# Patient Record
Sex: Female | Born: 1970
Health system: Southern US, Community
[De-identification: ages and names within clinical notes are randomized; demographics above are authoritative.]

## PROBLEM LIST (undated history)

## (undated) DIAGNOSIS — T7840XA Allergy, unspecified, initial encounter: Secondary | ICD-10-CM

## (undated) DIAGNOSIS — M1990 Unspecified osteoarthritis, unspecified site: Secondary | ICD-10-CM

## (undated) DIAGNOSIS — E039 Hypothyroidism, unspecified: Secondary | ICD-10-CM

## (undated) DIAGNOSIS — E038 Other specified hypothyroidism: Secondary | ICD-10-CM

## (undated) DIAGNOSIS — D649 Anemia, unspecified: Secondary | ICD-10-CM

## (undated) HISTORY — DX: Unspecified osteoarthritis, unspecified site: M19.90

## (undated) HISTORY — DX: Allergy, unspecified, initial encounter: T78.40XA

## (undated) HISTORY — DX: Other specified hypothyroidism: E03.8

## (undated) HISTORY — DX: Anemia, unspecified: D64.9

---
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
## 2012-02-04 HISTORY — PX: OOPHORECTOMY: SHX86

## 2017-01-30 ENCOUNTER — Other Ambulatory Visit: Payer: Self-pay | Admitting: Internal Medicine

## 2017-01-30 ENCOUNTER — Ambulatory Visit
Admission: RE | Admit: 2017-01-30 | Discharge: 2017-01-30 | Disposition: A | Discharge status: Home / Self Care | Payer: No Typology Code available for payment source | Source: Ambulatory Visit | Attending: Internal Medicine | Admitting: Internal Medicine

## 2017-01-30 DIAGNOSIS — R05 Cough: Secondary | ICD-10-CM

## 2017-01-30 DIAGNOSIS — R059 Cough, unspecified: Secondary | ICD-10-CM

## 2017-01-30 DIAGNOSIS — Z111 Encounter for screening for respiratory tuberculosis: Secondary | ICD-10-CM

## 2019-03-25 DIAGNOSIS — Z03818 Encounter for observation for suspected exposure to other biological agents ruled out: Secondary | ICD-10-CM | POA: Diagnosis not present

## 2019-03-25 DIAGNOSIS — Z20828 Contact with and (suspected) exposure to other viral communicable diseases: Secondary | ICD-10-CM | POA: Diagnosis not present

## 2019-05-20 ENCOUNTER — Encounter: Payer: Self-pay | Admitting: Family

## 2019-05-20 ENCOUNTER — Other Ambulatory Visit: Payer: Self-pay

## 2019-05-20 ENCOUNTER — Ambulatory Visit: Payer: BC Managed Care – PPO | Admitting: Family

## 2019-05-20 VITALS — BP 130/74 | HR 64 | Temp 97.3°F | Resp 16 | Ht 65.0 in | Wt 149.0 lb

## 2019-05-20 DIAGNOSIS — E039 Hypothyroidism, unspecified: Secondary | ICD-10-CM | POA: Diagnosis not present

## 2019-05-20 DIAGNOSIS — Z0000 Encounter for general adult medical examination without abnormal findings: Secondary | ICD-10-CM | POA: Diagnosis not present

## 2019-05-23 LAB — TEST AUTHORIZATION

## 2019-05-23 LAB — CBC WITH DIFFERENTIAL/PLATELET
Absolute Monocytes: 343 cells/uL (ref 200–950)
Basophils Absolute: 44 cells/uL (ref 0–200)
Basophils Relative: 0.6 %
Eosinophils Absolute: 131 cells/uL (ref 15–500)
Eosinophils Relative: 1.8 %
HCT: 38.3 % (ref 35.0–45.0)
Hemoglobin: 12.4 g/dL (ref 11.7–15.5)
Lymphs Abs: 3139 cells/uL (ref 850–3900)
MCH: 26.8 pg — ABNORMAL LOW (ref 27.0–33.0)
MCHC: 32.4 g/dL (ref 32.0–36.0)
MCV: 82.7 fL (ref 80.0–100.0)
MPV: 11.5 fL (ref 7.5–12.5)
Monocytes Relative: 4.7 %
Neutro Abs: 3643 cells/uL (ref 1500–7800)
Neutrophils Relative %: 49.9 %
Platelets: 123 10*3/uL — ABNORMAL LOW (ref 140–400)
RBC: 4.63 10*6/uL (ref 3.80–5.10)
RDW: 13.1 % (ref 11.0–15.0)
Total Lymphocyte: 43 %
WBC: 7.3 10*3/uL (ref 3.8–10.8)

## 2019-05-23 LAB — HEPATIC FUNCTION PANEL
AG Ratio: 1.4 (calc) (ref 1.0–2.5)
ALT: 20 U/L (ref 6–29)
AST: 21 U/L (ref 10–35)
Albumin: 4.5 g/dL (ref 3.6–5.1)
Alkaline phosphatase (APISO): 64 U/L (ref 31–125)
Bilirubin, Direct: 0 mg/dL (ref 0.0–0.2)
Globulin: 3.2 g/dL (calc) (ref 1.9–3.7)
Indirect Bilirubin: 0.3 mg/dL (calc) (ref 0.2–1.2)
Total Bilirubin: 0.3 mg/dL (ref 0.2–1.2)
Total Protein: 7.7 g/dL (ref 6.1–8.1)

## 2019-05-23 LAB — TSH: TSH: 4.77 mIU/L — ABNORMAL HIGH

## 2019-05-23 LAB — BASIC METABOLIC PANEL
BUN: 19 mg/dL (ref 7–25)
CO2: 29 mmol/L (ref 20–32)
Calcium: 9.6 mg/dL (ref 8.6–10.2)
Chloride: 101 mmol/L (ref 98–110)
Creat: 0.81 mg/dL (ref 0.50–1.10)
Glucose, Bld: 101 mg/dL — ABNORMAL HIGH (ref 65–99)
Potassium: 4.1 mmol/L (ref 3.5–5.3)
Sodium: 141 mmol/L (ref 135–146)

## 2019-05-23 LAB — LIPID PANEL
Cholesterol: 155 mg/dL (ref <200)
HDL: 46 mg/dL — ABNORMAL LOW (ref >=50)
LDL Cholesterol (Calc): 87 mg/dL (calc)
Non-HDL Cholesterol (Calc): 109 mg/dL (calc) (ref <130)
Total CHOL/HDL Ratio: 3.4 (calc) (ref <5.0)
Triglycerides: 121 mg/dL (ref <150)

## 2019-05-23 LAB — T4, FREE: Free T4: 1.2 ng/dL (ref 0.8–1.8)

## 2019-05-23 LAB — T3, FREE: T3, Free: 2.9 pg/mL (ref 2.3–4.2)

## 2019-05-24 ENCOUNTER — Encounter: Payer: Self-pay | Admitting: Family

## 2019-05-24 ENCOUNTER — Ambulatory Visit (HOSPITAL_BASED_OUTPATIENT_CLINIC_OR_DEPARTMENT_OTHER)
Admission: RE | Admit: 2019-05-24 | Discharge: 2019-05-24 | Disposition: A | Discharge status: Home / Self Care | Payer: BC Managed Care – PPO | Source: Ambulatory Visit | Attending: Family | Admitting: Family

## 2019-05-24 ENCOUNTER — Other Ambulatory Visit: Payer: Self-pay

## 2019-05-24 DIAGNOSIS — Z0000 Encounter for general adult medical examination without abnormal findings: Secondary | ICD-10-CM

## 2019-05-24 DIAGNOSIS — Z1231 Encounter for screening mammogram for malignant neoplasm of breast: Secondary | ICD-10-CM | POA: Diagnosis not present

## 2019-05-24 DIAGNOSIS — E039 Hypothyroidism, unspecified: Secondary | ICD-10-CM | POA: Insufficient documentation

## 2019-05-24 DIAGNOSIS — E038 Other specified hypothyroidism: Secondary | ICD-10-CM

## 2019-05-24 NOTE — Progress Notes (Signed)
Subjective:  Subjective  Patient ID: Amanda Roberson, female DOB: 1970/12/26, 49 y.o. MRN: [id number]  HPI  Patient is a 49 yr old female who presents today to establish care.  Came to [place] 2 years ago and has not had medical evaluation.  Patient presents today for complete physical.  Immunizations: tetanus- unsure  Diet: reports overall healthy diet  Exercise: no regular exercise  Pap Smear: reports 2 years ago in [place]- reports that it was normal.  Mammogram: never  Dental: 1 month ago  Vision: last year  LMP 1 year  pmhx- healthy  Review of Systems  Constitutional: Negative for unexpected weight change.  HENT: Negative for hearing loss and rhinorrhea.  Eyes: Negative for visual disturbance.  Respiratory: Negative for cough and shortness of breath.  Cardiovascular: Negative for chest pain.  Gastrointestinal: Negative for blood in stool, constipation (occasional) and diarrhea.  Genitourinary: Negative for dysuria and frequency.  Musculoskeletal: Positive for arthralgias (sometimes). Negative for myalgias.  Skin: Negative for rash.  Neurological: Negative for headaches.  Hematological: Negative for adenopathy.  Psychiatric/Behavioral:  Denies depression/anxiety  History reviewed. No pertinent past medical history.  Social History        Socioeconomic History  . Marital status: Married    Spouse name: Not on file  . Number of children: Not on file  . Years of education: Not on file  . Highest education level: Not on file  Occupational History  . Not on file  Tobacco Use  . Smoking status: Never Smoker  . Smokeless tobacco: Never Used  Substance and Sexual Activity  . Alcohol use: Never  . Drug use: Never  . Sexual activity: Yes    Partners: Male  Other Topics Concern  . Not on file  Social History Narrative   Moved from [place] 2019   Married   2 children in [place] (26 and 23)   Works at [employer]    Enjoys gardening/sewing   No pets   Completed 8th grade in [place] Strain:   . Difficulty of Paying Living Expenses:   Food Insecurity:   . Worried About Charity fundraiser in the Last Year:   . Arboriculturist in the Last Year:   Transportation Needs:   . Film/video editor (Medical):   Marland Kitchen Lack of Transportation (Non-Medical):   Physical Activity:   . Days of Exercise per Week:   . Minutes of Exercise per Session:   Stress:   . Feeling of Stress :   Social Connections:   . Frequency of Communication with Friends and Family:   . Frequency of Social Gatherings with Friends and Family:   . Attends Religious Services:   . Active Member of Clubs or Organizations:   . Attends Archivist Meetings:   Marland Kitchen Marital Status:   Intimate Partner Violence:   . Fear of Current or Ex-Partner:   . Emotionally Abused:   Marland Kitchen Physically Abused:   . Sexually Abused:         Past Surgical History:  Procedure Laterality Date  . OOPHORECTOMY Left 2014        Family History  Problem Relation Age of Onset  . Breast cancer Sister    died at age 32   Not on File  No current outpatient medications on file prior to visit.   No current facility-administered medications on file prior to visit.   BP  130/74 (BP Location: Right Arm, Patient Position: Sitting, Cuff Size: Small)  Pulse 64  Temp (!) 97.3 F (36.3 C) (Temporal)  Resp 16  Ht 5\' 5"  (1.651 m)  Wt 149 lb (67.6 kg)  SpO2 100%  BMI 24.79 kg/m  Objective:  Objective  Physical Exam  Physical Exam  Constitutional: She is oriented to person, place, and time. She appears well-developed and well-nourished. No distress.  HENT:  Head: Normocephalic and atraumatic.  Right Ear: Tympanic membrane and ear canal normal.  Left Ear: Tympanic membrane and ear canal normal.  Mouth/Throat: Oropharynx is clear and moist.  Eyes: Pupils are equal, round, and reactive to light. No scleral icterus.  Neck: Normal range of motion. No thyromegaly  present.  Cardiovascular: Normal rate and regular rhythm.  No murmur heard.  Pulmonary/Chest: Effort normal and breath sounds normal. No respiratory distress. He has no wheezes. She has no rales. She exhibits no tenderness.  Abdominal: Soft. Bowel sounds are normal. She exhibits no distension and no mass. There is no tenderness. There is no rebound and no guarding.  Musculoskeletal: She exhibits no edema.  Lymphadenopathy:  She has no cervical adenopathy.  Neurological: She is alert and oriented to person, place, and time. She has normal patellar reflexes. She exhibits normal muscle tone. Coordination normal.  Skin: Skin is warm and dry.  Psychiatric: She has a normal mood and affect. Her behavior is normal. Judgment and thought content normal.  Breasts: Examined lying  Right: Without masses, retractions, discharge or axillary adenopathy.  Left: Without masses, retractions, discharge or axillary adenopathy.  Inguinal/mons: Normal without inguinal adenopathy  External genitalia: Normal  BUS/Urethra/Skene's glands: Normal  Bladder: Normal  Vagina: Normal  Cervix: Normal  Uterus: normal in size, shape and contour. Midline and mobile  Adnexa/parametria:  Rt: Without masses or tenderness.  Lt: Without masses or tenderness.  Anus and perineum: Normal   Assessment & Plan:  Preventative care- plan pap next year. Mammogram scheduled. Obtain routine lab work.   Assessment & Plan:  A video interpreter was utilized today- #[id number]  This visit occurred during the SARS-CoV-2 public health emergency.  Safety protocols were in place, including screening questions prior to the visit, additional usage of staff PPE, and extensive cleaning of exam room while observing appropriate contact time as indicated for disinfecting solutions.

## 2019-06-02 NOTE — Progress Notes (Signed)
Printed for patient

## 2019-11-04 ENCOUNTER — Ambulatory Visit (INDEPENDENT_AMBULATORY_CARE_PROVIDER_SITE_OTHER): Payer: BC Managed Care – PPO | Admitting: Family

## 2019-11-04 ENCOUNTER — Other Ambulatory Visit: Payer: Self-pay

## 2019-11-04 ENCOUNTER — Other Ambulatory Visit: Payer: Self-pay | Admitting: Family

## 2019-11-04 VITALS — BP 127/83 | HR 66 | Temp 98.3°F | Resp 16 | Ht 65.0 in | Wt 153.0 lb

## 2019-11-04 DIAGNOSIS — Z23 Encounter for immunization: Secondary | ICD-10-CM

## 2019-11-04 DIAGNOSIS — E038 Other specified hypothyroidism: Secondary | ICD-10-CM

## 2019-11-04 DIAGNOSIS — E039 Hypothyroidism, unspecified: Secondary | ICD-10-CM

## 2019-11-04 DIAGNOSIS — N912 Amenorrhea, unspecified: Secondary | ICD-10-CM | POA: Diagnosis not present

## 2019-11-04 DIAGNOSIS — L237 Allergic contact dermatitis due to plants, except food: Secondary | ICD-10-CM

## 2019-11-04 LAB — ESTRADIOL: Estradiol: 16 pg/mL

## 2019-11-04 LAB — T3, FREE: T3, Free: 1.4 pg/mL — ABNORMAL LOW (ref 2.3–4.2)

## 2019-11-04 LAB — T4, FREE: Free T4: 0.3 ng/dL — ABNORMAL LOW (ref 0.8–1.8)

## 2019-11-04 LAB — FOLLICLE STIMULATING HORMONE: FSH: 74.1 m[IU]/mL

## 2019-11-04 LAB — TSH: TSH: 133.17 mIU/L — ABNORMAL HIGH

## 2019-11-04 LAB — LUTEINIZING HORMONE: LH: 37.6 m[IU]/mL

## 2019-11-04 MED ORDER — BETAMETHASONE VALERATE 0.1 % EX OINT: 1.0000 "application " | TOPICAL_OINTMENT | Freq: Two times a day (BID) | CUTANEOUS | 0 refills | Status: DC

## 2019-11-04 MED FILL — BETAMETHASONE VALER 0.1% OI: 0.1 | 15 days supply | Qty: 30 | Fill #0

## 2019-11-04 NOTE — Progress Notes (Signed)
Subjective:    Patient ID: Amanda Roberson, female    DOB: 1970-05-14, 49 y.o.   MRN: [id number]  HPI  Patient is a 49 yr old female who presents today with chief complaint of rash. Rash is worst on her hands but has on multiple other areas of her body. Started 2 weeks ago.  Reports associated itching.  She thinks that this is poison ivy.  She was working cleaning her back yard. Initially the itching was so bad that she could not sleep, however now the itching is less severe.   Amenorrhea- LMP 2019.   A Burmese video interpreter was utilized for today's  Hilbert Odor #[id number])  Review of Systems See HPI  Past Medical History:  Diagnosis Date  . Subclinical hypothyroidism      Social History   Socioeconomic History  . Marital status: Married    Spouse name: Not on file  . Number of children: Not on file  . Years of education: Not on file  . Highest education level: Not on file  Occupational History  . Not on file  Tobacco Use  . Smoking status: Never Smoker  . Smokeless tobacco: Never Used  Substance and Sexual Activity  . Alcohol use: Never  . Drug use: Never  . Sexual activity: Yes    Partners: Male  Other Topics Concern  . Not on file  Social History Narrative   Moved from [place] 2019   Married   2 children in [place] (26 and 23)   Works at [employer]    Enjoys gardening/sewing   No pets   Completed 8th grade in [place] Strain:   . Difficulty of Paying Living Expenses: Not on file  Food Insecurity:   . Worried About Charity fundraiser in the Last Year: Not on file  . Ran Out of Food in the Last Year: Not on file  Transportation Needs:   . Lack of Transportation (Medical): Not on file  . Lack of Transportation (Non-Medical): Not on file  Physical Activity:   . Days of Exercise per Week: Not on file  . Minutes of Exercise per Session: Not on file  Stress:   . Feeling of Stress : Not on file  Social Connections:     . Frequency of Communication with Friends and Family: Not on file  . Frequency of Social Gatherings with Friends and Family: Not on file  . Attends Religious Services: Not on file  . Active Member of Clubs or Organizations: Not on file  . Attends Archivist Meetings: Not on file  . Marital Status: Not on file  Intimate Partner Violence:   . Fear of Current or Ex-Partner: Not on file  . Emotionally Abused: Not on file  . Physically Abused: Not on file  . Sexually Abused: Not on file    Past Surgical History:  Procedure Laterality Date  . OOPHORECTOMY Left 2014    Family History  Problem Relation Age of Onset  . Breast cancer Sister        died at age 43    Not on File  No current outpatient medications on file prior to visit.   No current facility-administered medications on file prior to visit.    BP 127/83 (BP Location: Right Arm, Patient Position: Sitting, Cuff Size: Small)   Pulse 66   Temp 98.3 F (36.8 C) (Oral)   Resp 16   Ht 5'  5" (1.651 m)   Wt 153 lb (69.4 kg)   SpO2 99%   BMI 25.46 kg/m       Objective:   Physical Exam Constitutional:      Appearance: Normal appearance.  HENT:     Head: Normocephalic and atraumatic.  Skin:    Comments: Erythematous linear rash noted on bilateral forearms, hands, ankles  Neurological:     Mental Status: She is alert.           Assessment & Plan:  Poison Ivy Dermatitis-will rx with betamethasone cream. I don't think she requires oral steroids at this point. Pt is advised to call if symptoms worsen or if symptoms fail to improve.  Amenorrhea- pt would like testing to confirm menopause.  Order FSH/LH and estradiol.  Hypothyroid- Last visit TSH was noted to be mildly elevated with normal free t3 and free t4. Obtain follow up free T3, Free T4 and TSH.  This visit occurred during the SARS-CoV-2 public health emergency.  Safety protocols were in place, including screening questions prior to the visit,  additional usage of staff PPE, and extensive cleaning of exam room while observing appropriate contact time as indicated for disinfecting solutions.

## 2019-11-06 ENCOUNTER — Other Ambulatory Visit: Payer: Self-pay | Admitting: Family

## 2019-11-06 DIAGNOSIS — E039 Hypothyroidism, unspecified: Secondary | ICD-10-CM

## 2019-11-06 NOTE — Telephone Encounter (Signed)
Do we have access to a telephone interpretor?  I think we do- Amanda Roberson may have this information.  Please contact pt and let her know that her lab work confirms that she is in menopause.    Lab work shows that she has an underactive thyroid.  I would like for her to start synthroid 14mcg once daily in the AM and return for lab work in 6 weeks to repeat TSH.

## 2019-11-08 NOTE — Telephone Encounter (Signed)
Called patient with Burmese interpreter on the line, she did not answer. Interpreter left voice mail to call us back about her lab results.

## 2019-11-23 ENCOUNTER — Other Ambulatory Visit: Payer: Self-pay | Admitting: Family

## 2019-11-23 MED ORDER — LEVOTHYROXINE SODIUM 50 MCG PO TABS: 50.0000 ug | ORAL_TABLET | Freq: Every day | ORAL | 1 refills | Status: DC

## 2019-11-23 MED FILL — LEVOTHYROXINE SODIUM 50 MCG: 50 | 90 days supply | Qty: 90 | Fill #0

## 2019-11-23 NOTE — Telephone Encounter (Signed)
Please send her a letter as follows:  Lab work shows that you have an underactive thyroid.  I would like for you to start synthroid 38mcg once daily in the AM and return for lab work in 6 weeks to repeat your blood test. The new prescription has been sent to the [facility].

## 2019-11-23 NOTE — Progress Notes (Signed)
Mailed out to patient 

## 2019-11-24 NOTE — Telephone Encounter (Signed)
Letter with this information mailed to patient to home address on file.

## 2019-12-05 MED FILL — LEVOTHYROXINE SODIUM 50 MCG: 50 | 90 days supply | Qty: 90 | Fill #0

## 2020-01-24 ENCOUNTER — Other Ambulatory Visit: Payer: Self-pay

## 2020-01-24 ENCOUNTER — Other Ambulatory Visit (INDEPENDENT_AMBULATORY_CARE_PROVIDER_SITE_OTHER): Payer: BC Managed Care – PPO

## 2020-01-24 DIAGNOSIS — E039 Hypothyroidism, unspecified: Secondary | ICD-10-CM

## 2020-01-24 NOTE — Addendum Note (Signed)
Addended by: Kelle Darting A on: 01/24/2020 03:14 PM   Modules accepted: Orders

## 2020-01-25 ENCOUNTER — Other Ambulatory Visit: Payer: Self-pay | Admitting: Family

## 2020-01-25 ENCOUNTER — Telehealth: Payer: Self-pay | Admitting: Family

## 2020-01-25 LAB — TSH: TSH: 16.6 u[IU]/mL — ABNORMAL HIGH (ref 0.35–4.50)

## 2020-01-25 MED ORDER — LEVOTHYROXINE SODIUM 75 MCG PO TABS: 75.0000 ug | ORAL_TABLET | Freq: Every day | ORAL | 0 refills | Status: DC

## 2020-01-25 MED FILL — LEVOTHYROXINE SODIUM 75 MCG: 75 | 90 days supply | Qty: 90 | Fill #0

## 2020-01-25 NOTE — Telephone Encounter (Signed)
Thyroid test is improving but still shows synthroid needs to be adjusted. Please increase synthroid from 50 mcg once daily to 75 mcg once daily and repeat TSH in 6 weeks. Dx hypothyroid.

## 2020-01-25 NOTE — Telephone Encounter (Signed)
Appointment is scheduled. Pt and family are aware of appointment and new medication.

## 2020-03-06 ENCOUNTER — Other Ambulatory Visit (INDEPENDENT_AMBULATORY_CARE_PROVIDER_SITE_OTHER): Payer: BC Managed Care – PPO

## 2020-03-06 ENCOUNTER — Other Ambulatory Visit: Payer: Self-pay

## 2020-03-06 DIAGNOSIS — E039 Hypothyroidism, unspecified: Secondary | ICD-10-CM | POA: Diagnosis not present

## 2020-03-07 ENCOUNTER — Other Ambulatory Visit: Payer: Self-pay

## 2020-03-07 ENCOUNTER — Other Ambulatory Visit: Payer: BC Managed Care – PPO

## 2020-03-07 ENCOUNTER — Telehealth: Payer: Self-pay | Admitting: Family

## 2020-03-07 DIAGNOSIS — E039 Hypothyroidism, unspecified: Secondary | ICD-10-CM

## 2020-03-07 LAB — TSH: TSH: 0.29 u[IU]/mL — ABNORMAL LOW (ref 0.35–4.50)

## 2020-03-07 MED ORDER — LEVOTHYROXINE SODIUM 75 MCG PO TABS: ORAL_TABLET | ORAL | 0 refills | Status: DC

## 2020-03-07 NOTE — Telephone Encounter (Signed)
Called patient earlier but was not able to communicate due to language barrier.  Patient called back with Burmese interpreter was was given instructions to continue to take 75 mcg every day except for satuerdays when she will take 1/2 a tablet.  She was scheduled to come back for [facility] 04-20-2020

## 2020-03-07 NOTE — Telephone Encounter (Signed)
Repeat TSH in 6 weeks

## 2020-03-07 NOTE — Telephone Encounter (Signed)
TSH is almost where we need it to be.  I would recommend that she continue synthroid 75 mcg once daily except take only 1/2 tablet on Saturdays.

## 2020-04-20 ENCOUNTER — Other Ambulatory Visit (INDEPENDENT_AMBULATORY_CARE_PROVIDER_SITE_OTHER): Payer: BC Managed Care – PPO

## 2020-04-20 ENCOUNTER — Other Ambulatory Visit: Payer: Self-pay

## 2020-04-20 DIAGNOSIS — E039 Hypothyroidism, unspecified: Secondary | ICD-10-CM

## 2020-04-21 LAB — TSH: TSH: 2.5 mIU/L

## 2020-05-04 ENCOUNTER — Telehealth: Payer: Self-pay | Admitting: Family

## 2020-05-04 NOTE — Telephone Encounter (Signed)
Patient would also like to know if she had continue medication for thyroid, if so she will need a refill.

## 2020-05-04 NOTE — Telephone Encounter (Signed)
Patient would like a call back in reference to lab results

## 2020-05-05 ENCOUNTER — Other Ambulatory Visit: Payer: Self-pay | Admitting: Family

## 2020-05-05 ENCOUNTER — Other Ambulatory Visit (HOSPITAL_BASED_OUTPATIENT_CLINIC_OR_DEPARTMENT_OTHER): Payer: Self-pay

## 2020-05-05 MED ORDER — LEVOTHYROXINE SODIUM 75 MCG PO TABS
ORAL_TABLET | ORAL | 1 refills | Status: DC
  Filled 2020-05-05: qty 78, 84d supply, fill #0

## 2020-05-05 NOTE — Telephone Encounter (Signed)
Yes, please continue levothyroxine. Refill sent to pharmacy.

## 2020-05-07 NOTE — Telephone Encounter (Signed)
Lvm for patient to call back

## 2020-05-07 NOTE — Telephone Encounter (Signed)
Patient called back with a translator, she was advised of results and to continue the medication. She was scheduled to return in 3 months.

## 2020-06-05 ENCOUNTER — Encounter: Payer: Self-pay | Admitting: Family

## 2020-06-05 ENCOUNTER — Other Ambulatory Visit: Payer: Self-pay

## 2020-06-05 ENCOUNTER — Other Ambulatory Visit (HOSPITAL_COMMUNITY)
Admission: RE | Admit: 2020-06-05 | Discharge: 2020-06-05 | Disposition: A | Discharge status: Home / Self Care | Payer: BC Managed Care – PPO | Source: Ambulatory Visit | Attending: Family | Admitting: Family

## 2020-06-05 ENCOUNTER — Ambulatory Visit (INDEPENDENT_AMBULATORY_CARE_PROVIDER_SITE_OTHER): Payer: BC Managed Care – PPO | Admitting: Family

## 2020-06-05 VITALS — BP 130/71 | HR 77 | Temp 98.5°F | Resp 16 | Ht 65.0 in | Wt 149.0 lb

## 2020-06-05 DIAGNOSIS — E039 Hypothyroidism, unspecified: Secondary | ICD-10-CM

## 2020-06-05 DIAGNOSIS — Z1159 Encounter for screening for other viral diseases: Secondary | ICD-10-CM | POA: Diagnosis not present

## 2020-06-05 DIAGNOSIS — Z01419 Encounter for gynecological examination (general) (routine) without abnormal findings: Secondary | ICD-10-CM | POA: Insufficient documentation

## 2020-06-05 DIAGNOSIS — Z0000 Encounter for general adult medical examination without abnormal findings: Secondary | ICD-10-CM | POA: Diagnosis not present

## 2020-06-05 DIAGNOSIS — Z114 Encounter for screening for human immunodeficiency virus [HIV]: Secondary | ICD-10-CM

## 2020-06-05 NOTE — Progress Notes (Signed)
Subjective:    Patient ID: Amanda Roberson, female    DOB: 12/02/1970, 50 y.o.   MRN: [id number]  HPI  Patient presents today for complete physical.  Immunizations: had J and J x 2.  She has not had a 3rd shot, unsure of last tetanus shot- thinks 3 years ago Diet: healthy Exercise: some days she does some stretching exercise.   Colonoscopy: due Pap Smear: due Mammogram: due Dental:  due Vision: up to date  She reports that she is following with Endo.   Used video interpretor [id number]   Review of Systems  Constitutional: Negative for unexpected weight change.  HENT: Positive for rhinorrhea (mild allergies). Negative for hearing loss.   Eyes: Negative for visual disturbance.  Respiratory: Negative for cough and shortness of breath.   Cardiovascular: Negative for leg swelling.  Gastrointestinal: Negative for constipation and diarrhea.  Genitourinary: Negative for dysuria and frequency.  Musculoskeletal:       Some left heel pain in the am  Neurological: Negative for headaches.  Hematological: Negative for adenopathy.  Psychiatric/Behavioral:       Denies depression/anxiety   Past Medical History:  Diagnosis Date  . Subclinical hypothyroidism      Social History   Socioeconomic History  . Marital status: Married    Spouse name: Not on file  . Number of children: Not on file  . Years of education: Not on file  . Highest education level: Not on file  Occupational History  . Not on file  Tobacco Use  . Smoking status: Never Smoker  . Smokeless tobacco: Never Used  Substance and Sexual Activity  . Alcohol use: Never  . Drug use: Never  . Sexual activity: Yes    Partners: Male  Other Topics Concern  . Not on file  Social History Narrative   Moved from [place] 2019   Married   2 children in [place] (26 and 23)   Works at [employer]    Enjoys gardening/sewing   No pets   Completed 8th grade in [place] Strain: Not on  [employer] Insecurity: Not on file  Transportation Needs: Not on file  Physical Activity: Not on file  Stress: Not on file  Social Connections: Not on file  Intimate Partner Violence: Not on file    Past Surgical History:  Procedure Laterality Date  . OOPHORECTOMY Left 2014    Family History  Problem Relation Age of Onset  . Breast cancer Sister        died at age 3    Not on File  Current Outpatient Medications on File Prior to Visit  Medication Sig Dispense Refill  . levothyroxine (SYNTHROID) 75 MCG tablet TAKE 1 TABLET BY MOUTH ONCE DAILY EXCEPT 1/2 TABLET ON SATURDAYS 90 tablet 1   No current facility-administered medications on file prior to visit.    BP 130/71 (BP Location: Right Arm, Patient Position: Sitting, Cuff Size: Small)   Pulse 77   Temp 98.5 F (36.9 C) (Oral)   Resp 16   Ht 5\' 5"  (1.651 m)   Wt 149 lb (67.6 kg)   SpO2 100%   BMI 24.79 kg/m        Objective:   Physical Exam  Physical Exam  Constitutional: She is oriented to person, place, and time. She appears well-developed and well-nourished. No distress.  HENT:  Head: Normocephalic and atraumatic.  Right Ear: Tympanic membrane and ear canal normal.  Left Ear: Tympanic membrane and ear canal normal.  Mouth/Throat: Not examined- pt wearing mask Eyes: Pupils are equal, round, and reactive to light. No scleral icterus.  Neck: Normal range of motion. No thyromegaly present.  Cardiovascular: Normal rate and regular rhythm.   No murmur heard. Pulmonary/Chest: Effort normal and breath sounds normal. No respiratory distress. He has no wheezes. She has no rales. She exhibits no tenderness.  Abdominal: Soft. Bowel sounds are normal. She exhibits no distension and no mass. There is no tenderness. There is no rebound and no guarding.  Musculoskeletal: She exhibits no edema.  Lymphadenopathy:    She has no cervical adenopathy.  Neurological: She is alert and oriented to person, place, and time. She  has normal patellar reflexes. She exhibits normal muscle tone. Coordination normal.  Skin: Skin is warm and dry.  Psychiatric: She has a normal mood and affect. Her behavior is normal. Judgment and thought content normal.  Breasts: Examined lying Right: Without masses, retractions, discharge or axillary adenopathy.  Left: Without masses, retractions, discharge or axillary adenopathy.  Inguinal/mons: Normal without inguinal adenopathy  External genitalia: Normal  BUS/Urethra/Skene's glands: Normal  Bladder: Normal  Vagina: Normal  Cervix: Normal  Uterus: normal in size, shape and contour. Midline and mobile  Adnexa/parametria:  Rt: Without masses or tenderness.  Lt: Without masses or tenderness.  Anus and perineum: Normal            Assessment & Plan:    Preventative care- will obtain labs as ordered at pt's request.  Refer for mammogram, colo.  Immunizations reviewed.   This visit occurred during the SARS-CoV-2 public health emergency.  Safety protocols were in place, including screening questions prior to the visit, additional usage of staff PPE, and extensive cleaning of exam room while observing appropriate contact time as indicated for disinfecting solutions.        Assessment & Plan:

## 2020-06-05 NOTE — Patient Instructions (Addendum)
Please schedule a routine dental visit.  Please ice your left foot twice daily as needed and wear good supportive shoes.

## 2020-06-06 ENCOUNTER — Telehealth: Payer: Self-pay

## 2020-06-06 LAB — LIPID PANEL
Cholesterol: 164 mg/dL (ref 0–200)
HDL: 44.6 mg/dL (ref >39.00)
LDL Cholesterol: 101 mg/dL — ABNORMAL HIGH (ref 0–99)
NonHDL: 119.14
Total CHOL/HDL Ratio: 4
Triglycerides: 89 mg/dL (ref 0.0–149.0)
VLDL: 17.8 mg/dL (ref 0.0–40.0)

## 2020-06-06 LAB — COMPREHENSIVE METABOLIC PANEL
ALT: 20 U/L (ref 0–35)
AST: 21 U/L (ref 0–37)
Albumin: 4.3 g/dL (ref 3.5–5.2)
Alkaline Phosphatase: 59 U/L (ref 39–117)
BUN: 18 mg/dL (ref 6–23)
CO2: 30 mEq/L (ref 19–32)
Calcium: 9.5 mg/dL (ref 8.4–10.5)
Chloride: 103 mEq/L (ref 96–112)
Creatinine, Ser: 0.69 mg/dL (ref 0.40–1.20)
GFR: 102.55 mL/min (ref >60.00)
Glucose, Bld: 88 mg/dL (ref 70–99)
Potassium: 4 mEq/L (ref 3.5–5.1)
Sodium: 140 mEq/L (ref 135–145)
Total Bilirubin: 0.3 mg/dL (ref 0.2–1.2)
Total Protein: 7.6 g/dL (ref 6.0–8.3)

## 2020-06-06 LAB — HIV ANTIBODY (ROUTINE TESTING W REFLEX): HIV 1&2 Ab, 4th Generation: NONREACTIVE

## 2020-06-06 LAB — HEPATITIS C ANTIBODY
Hepatitis C Ab: NONREACTIVE
SIGNAL TO CUT-OFF: 0.02 (ref <1.00)

## 2020-06-06 NOTE — Telephone Encounter (Signed)
Called left voicemail for patient to return call to office to schedule lab appointment for recollect on CBC with diff.

## 2020-06-07 ENCOUNTER — Other Ambulatory Visit (INDEPENDENT_AMBULATORY_CARE_PROVIDER_SITE_OTHER): Payer: BC Managed Care – PPO

## 2020-06-07 ENCOUNTER — Other Ambulatory Visit: Payer: Self-pay

## 2020-06-07 DIAGNOSIS — E039 Hypothyroidism, unspecified: Secondary | ICD-10-CM

## 2020-06-07 DIAGNOSIS — Z0000 Encounter for general adult medical examination without abnormal findings: Secondary | ICD-10-CM | POA: Diagnosis not present

## 2020-06-07 NOTE — Addendum Note (Signed)
Addended by: Manuela Schwartz on: 06/07/2020 03:15 PM   Modules accepted: Orders

## 2020-06-08 ENCOUNTER — Encounter: Payer: Self-pay | Admitting: Family

## 2020-06-08 LAB — CBC WITH DIFFERENTIAL/PLATELET
Basophils Absolute: 0 10*3/uL (ref 0.0–0.1)
Basophils Relative: 0.8 % (ref 0.0–3.0)
Eosinophils Absolute: 0.1 10*3/uL (ref 0.0–0.7)
Eosinophils Relative: 2.9 % (ref 0.0–5.0)
HCT: 35.2 % — ABNORMAL LOW (ref 36.0–46.0)
Hemoglobin: 11.4 g/dL — ABNORMAL LOW (ref 12.0–15.0)
Lymphocytes Relative: 46.5 % — ABNORMAL HIGH (ref 12.0–46.0)
Lymphs Abs: 2 10*3/uL (ref 0.7–4.0)
MCHC: 32.4 g/dL (ref 30.0–36.0)
MCV: 83.2 fl (ref 78.0–100.0)
Monocytes Absolute: 0.4 10*3/uL (ref 0.1–1.0)
Monocytes Relative: 9.5 % (ref 3.0–12.0)
Neutro Abs: 1.7 10*3/uL (ref 1.4–7.7)
Neutrophils Relative %: 40.3 % — ABNORMAL LOW (ref 43.0–77.0)
Platelets: 147 10*3/uL — ABNORMAL LOW (ref 150.0–400.0)
RBC: 4.23 Mil/uL (ref 3.87–5.11)
RDW: 13.9 % (ref 11.5–15.5)
WBC: 3.6 10*3/uL — ABNORMAL LOW (ref 4.0–10.5)

## 2020-06-08 LAB — CYTOLOGY - PAP
Comment: NEGATIVE
Diagnosis: NEGATIVE
High risk HPV: NEGATIVE

## 2020-06-08 NOTE — Progress Notes (Signed)
Mailed out to patient 

## 2020-06-11 ENCOUNTER — Ambulatory Visit (HOSPITAL_BASED_OUTPATIENT_CLINIC_OR_DEPARTMENT_OTHER)
Admission: RE | Admit: 2020-06-11 | Discharge: 2020-06-11 | Disposition: A | Discharge status: Home / Self Care | Payer: BC Managed Care – PPO | Source: Ambulatory Visit | Attending: Family | Admitting: Family

## 2020-06-11 ENCOUNTER — Other Ambulatory Visit: Payer: Self-pay

## 2020-06-11 ENCOUNTER — Encounter (HOSPITAL_BASED_OUTPATIENT_CLINIC_OR_DEPARTMENT_OTHER): Payer: Self-pay

## 2020-06-11 DIAGNOSIS — Z0000 Encounter for general adult medical examination without abnormal findings: Secondary | ICD-10-CM

## 2020-06-11 DIAGNOSIS — Z1231 Encounter for screening mammogram for malignant neoplasm of breast: Secondary | ICD-10-CM | POA: Diagnosis not present

## 2020-07-27 ENCOUNTER — Other Ambulatory Visit: Payer: Self-pay

## 2020-07-27 ENCOUNTER — Other Ambulatory Visit (HOSPITAL_BASED_OUTPATIENT_CLINIC_OR_DEPARTMENT_OTHER): Payer: Self-pay

## 2020-07-27 ENCOUNTER — Ambulatory Visit: Payer: BC Managed Care – PPO | Admitting: Family

## 2020-07-27 ENCOUNTER — Encounter: Payer: Self-pay | Admitting: Family

## 2020-07-27 VITALS — BP 121/66 | HR 70 | Temp 98.5°F | Resp 16 | Wt 149.0 lb

## 2020-07-27 DIAGNOSIS — E039 Hypothyroidism, unspecified: Secondary | ICD-10-CM | POA: Diagnosis not present

## 2020-07-27 DIAGNOSIS — Z0000 Encounter for general adult medical examination without abnormal findings: Secondary | ICD-10-CM

## 2020-07-27 LAB — TSH: TSH: 0.88 mIU/L

## 2020-07-27 MED ORDER — LEVOTHYROXINE SODIUM 75 MCG PO TABS
ORAL_TABLET | ORAL | 0 refills | Status: DC
  Filled 2020-07-27: qty 78, 84d supply, fill #0
  Filled 2020-10-16: qty 12, 14d supply, fill #1

## 2020-07-27 NOTE — Assessment & Plan Note (Signed)
Clinically stable on synthroid 75 mcg once daily except 1/2 tab once a week.  Obtain follow up tsh.

## 2020-07-27 NOTE — Patient Instructions (Signed)
Please complete lab work prior to leaving.   

## 2020-07-27 NOTE — Progress Notes (Signed)
Subjective:   By signing my name below, I, [name], attest that this documentation has been prepared under the direction and in the presence of Debbrah Alar NP. 07/27/2020      Patient ID: Amanda Roberson, female    DOB: 1971/01/25, 50 y.o.   MRN: [id number]  Chief Complaint  Patient presents with   Hypothyroidism    Here for follow up    HPI Patient is in today for a office visit.  A [place] Armed forces training and education officer to assist in communication.  Thyroid- She has ran out of is requesting a refill of 75 mcg synthroid daily PO. She has been consistently taking them daily. She reports her energy levels have increased since taking them. Immunizations- She has 2 [employer] and [employer]. Colonoscopy- She is due for a colonoscopy and is willing to set up an appointment.   Health Maintenance Due  Topic Date Due   COLONOSCOPY (Pts 45-30yrs Insurance coverage will need to be confirmed)  Never done   COVID-19 Vaccine (3 - Booster for [employer] series) 07/14/2020    Past Medical History:  Diagnosis Date   Subclinical hypothyroidism     Past Surgical History:  Procedure Laterality Date   OOPHORECTOMY Left 2014    Family History  Problem Relation Age of Onset   Breast cancer Sister        died at age 42    Social History   Socioeconomic History   Marital status: Married    Spouse name: Not on file   Number of children: Not on file   Years of education: Not on file   Highest education level: Not on file  Occupational History   Not on file  Tobacco Use   Smoking status: Never   Smokeless tobacco: Never  Substance and Sexual Activity   Alcohol use: Never   Drug use: Never   Sexual activity: Yes    Partners: Male  Other Topics Concern   Not on file  Social History Narrative   Moved from [place] 2019   Married   2 children in [place] (26 and 23)   Works at [employer]    Enjoys gardening/sewing   No pets   Completed 8th grade in [place] Strain: Not on [employer] Insecurity: Not on file  Transportation Needs: Not on file  Physical Activity: Not on file  Stress: Not on file  Social Connections: Not on file  Intimate Partner Violence: Not on file    Outpatient Medications Prior to Visit  Medication Sig Dispense Refill   levothyroxine (SYNTHROID) 75 MCG tablet TAKE 1 TABLET BY MOUTH ONCE DAILY EXCEPT 1/2 TABLET ON SATURDAYS 90 tablet 1   No facility-administered medications prior to visit.    Not on File  ROS See HPI    Objective:    Physical Exam Constitutional:      Appearance: Normal appearance.  HENT:     Head: Normocephalic and atraumatic.  Neurological:     Mental Status: She is alert and oriented to person, place, and time.  Psychiatric:        Mood and Affect: Mood normal.        Behavior: Behavior normal.        Thought Content: Thought content normal.        Judgment: Judgment normal.    BP 121/66 (BP Location: Right Arm, Patient Position: Sitting, Cuff Size: Small)   Pulse 70  Temp 98.5 F (36.9 C) (Oral)   Resp 16   Wt 149 lb (67.6 kg)   SpO2 99%   BMI 24.79 kg/m  Wt Readings from Last 3 Encounters:  07/27/20 149 lb (67.6 kg)  06/05/20 149 lb (67.6 kg)  11/04/19 153 lb (69.4 kg)       Assessment & Plan:   Problem List Items Addressed This Visit       Unprioritized   Hypothyroid    Clinically stable on synthroid 75 mcg once daily except 1/2 tab once a week.  Obtain follow up tsh.        Relevant Medications   levothyroxine (SYNTHROID) 75 MCG tablet   Other Relevant Orders   TSH   Other Visit Diagnoses     Preventative health care    -  Primary   Relevant Orders   Ambulatory referral to Gastroenterology        Meds ordered this encounter  Medications   levothyroxine (SYNTHROID) 75 MCG tablet    Sig: TAKE 1 TABLET BY MOUTH ONCE DAILY EXCEPT 1/2 TABLET ON SATURDAYS    Dispense:  90 tablet    Refill:  0    Order Specific  Question:   Supervising Provider    Answer:   Mosie Lukes [[id number]]     I, Debbrah Alar NP, personally preformed the services described in this documentation.  All medical record entries made by the scribe were at my direction and in my presence.  I have reviewed the chart and discharge instructions (if applicable) and agree that the record reflects my personal performance and is accurate and complete. 07/27/2020   I,[name],acting as a Education administrator for Nance Pear, NP.,have documented all relevant documentation on the behalf of Nance Pear, NP,as directed by  Nance Pear, NP while in the presence of Nance Pear, NP.   Nance Pear, NP

## 2020-10-16 ENCOUNTER — Other Ambulatory Visit (HOSPITAL_BASED_OUTPATIENT_CLINIC_OR_DEPARTMENT_OTHER): Payer: Self-pay

## 2020-10-30 ENCOUNTER — Other Ambulatory Visit: Payer: Self-pay

## 2020-10-30 ENCOUNTER — Other Ambulatory Visit (HOSPITAL_BASED_OUTPATIENT_CLINIC_OR_DEPARTMENT_OTHER): Payer: Self-pay

## 2020-10-30 ENCOUNTER — Encounter: Payer: Self-pay | Admitting: Family

## 2020-10-30 ENCOUNTER — Ambulatory Visit: Payer: BC Managed Care – PPO | Admitting: Family

## 2020-10-30 VITALS — BP 137/64 | HR 70 | Temp 98.6°F | Resp 16 | Wt 154.0 lb

## 2020-10-30 DIAGNOSIS — E039 Hypothyroidism, unspecified: Secondary | ICD-10-CM | POA: Diagnosis not present

## 2020-10-30 DIAGNOSIS — M722 Plantar fascial fibromatosis: Secondary | ICD-10-CM | POA: Diagnosis not present

## 2020-10-30 DIAGNOSIS — Z23 Encounter for immunization: Secondary | ICD-10-CM

## 2020-10-30 MED ORDER — LEVOTHYROXINE SODIUM 75 MCG PO TABS
ORAL_TABLET | ORAL | 1 refills | Status: DC
  Filled 2020-10-30: qty 84, 90d supply, fill #0
  Filled 2021-02-04: qty 84, 90d supply, fill #1
  Filled 2021-05-03: qty 12, 13d supply, fill #2

## 2020-10-30 MED ORDER — MELOXICAM 7.5 MG PO TABS
7.5000 mg | ORAL_TABLET | Freq: Every day | ORAL | 0 refills | Status: DC
  Filled 2020-10-30: qty 14, 14d supply, fill #0

## 2020-10-30 NOTE — Progress Notes (Signed)
Subjective:   By signing my name below, I, Lyric Barr-McArthur, attest that this documentation has been prepared under the direction and in the presence of Debbrah Alar, NP, 10/30/2020   Patient ID: Amanda Roberson, female    DOB: [birth year], 50 y.o.   MRN: [id number]  No chief complaint on file.   HPI Patient is in today for an office visit. There is a Optometrist present via [facility] [phone])  Thyroid: She notes that her use of 75 mcg synthroid is going well and she is not experiencing any side effects since starting.  Pain in left heel: She is experiencing pain in her left heel especially after walking and putting weight on it. She notes it is swollen. She denies any injury or trauma to the foot.  Colonoscopy: She is inquiring about her colonoscopy that is set for 11/19/2020. She is aware she needs to pick up the prep medications for it three days before the procedure.  Next appointment: She would like to get her physical exam scheduled today to be updated.   Health Maintenance Due  Topic Date Due   COLONOSCOPY (Pts 45-42yrs Insurance coverage will need to be confirmed)  Never done   COVID-19 Vaccine (3 - Booster for [employer] series) 07/14/2020   INFLUENZA VACCINE  09/03/2020    Past Medical History:  Diagnosis Date   Subclinical hypothyroidism     Past Surgical History:  Procedure Laterality Date   OOPHORECTOMY Left 2014    Family History  Problem Relation Age of Onset   Breast cancer Sister        died at age 43    Social History   Socioeconomic History   Marital status: Married    Spouse name: Not on file   Number of children: Not on file   Years of education: Not on file   Highest education level: Not on file  Occupational History   Not on file  Tobacco Use   Smoking status: Never   Smokeless tobacco: Never  Substance and Sexual Activity   Alcohol use: Never   Drug use: Never   Sexual activity: Yes    Partners: Male  Other Topics Concern   Not on  file  Social History Narrative   Moved from [place] 2019   Married   2 children in [place] (26 and 23)   Works at [employer]    Enjoys gardening/sewing   No pets   Completed 8th grade in [place] Strain: Not on [employer] Insecurity: Not on file  Transportation Needs: Not on file  Physical Activity: Not on file  Stress: Not on file  Social Connections: Not on file  Intimate Partner Violence: Not on file    Outpatient Medications Prior to Visit  Medication Sig Dispense Refill   levothyroxine (SYNTHROID) 75 MCG tablet TAKE 1 TABLET BY MOUTH ONCE DAILY EXCEPT 1/2 TABLET ON SATURDAYS 90 tablet 0   No facility-administered medications prior to visit.    Not on File  Review of Systems  Musculoskeletal:        (+) mild edema in left bottom of heel  (+) tenderness in left heel       Objective:    Physical Exam Constitutional:      General: She is not in acute distress.    Appearance: Normal appearance. She is not ill-appearing.  HENT:     Head: Normocephalic and atraumatic.     Right Ear:  External ear normal.     Left Ear: External ear normal.  Eyes:     Extraocular Movements: Extraocular movements intact.     Pupils: Pupils are equal, round, and reactive to light.  Cardiovascular:     Rate and Rhythm: Normal rate and regular rhythm.     Heart sounds: Normal heart sounds. No murmur heard.   No gallop.  Pulmonary:     Effort: Pulmonary effort is normal. No respiratory distress.     Breath sounds: Normal breath sounds. No wheezing or rales.  Feet:     Comments: (+) mild edema in left heel (+) tenderness with palpitation in left heel  Lymphadenopathy:     Cervical: No cervical adenopathy.  Skin:    General: Skin is warm and dry.  Neurological:     Mental Status: She is alert and oriented to person, place, and time.  Psychiatric:        Behavior: Behavior normal.        Judgment: Judgment normal.    BP 137/64 (BP  Location: Right Arm, Patient Position: Sitting, Cuff Size: Small)   Pulse 70   Temp 98.6 F (37 C) (Oral)   Resp 16   Wt 154 lb (69.9 kg)   SpO2 99%   BMI 25.63 kg/m  Wt Readings from Last 3 Encounters:  10/30/20 154 lb (69.9 kg)  07/27/20 149 lb (67.6 kg)  06/05/20 149 lb (67.6 kg)       Assessment & Plan:   Problem List Items Addressed This Visit       Unprioritized   Plantar fasciitis of left foot - Primary    New, advised pt to begin short course of meloxicam 7.5mg  once daily along with bid icing.  Call if symptoms worsen or if symptoms fail to improve in the next few weeks.       Hypothyroid    Clinically stable on synthroid. Continue current dose.   Lab Results  Component Value Date   TSH 0.88 07/27/2020         Relevant Medications   levothyroxine (SYNTHROID) 75 MCG tablet   Meds ordered this encounter  Medications   meloxicam (MOBIC) 7.5 MG tablet    Sig: Take 1 tablet (7.5 mg total) by mouth daily.    Dispense:  14 tablet    Refill:  0    Order Specific Question:   Supervising Provider    Answer:   Penni Homans A [[id number]]   levothyroxine (SYNTHROID) 75 MCG tablet    Sig: TAKE 1 TABLET BY MOUTH ONCE DAILY EXCEPT 1/2 TABLET ON SATURDAYS    Dispense:  90 tablet    Refill:  1    Order Specific Question:   Supervising Provider    Answer:   Mosie Lukes [[id number]]    I, Debbrah Alar, NP, personally preformed the services described in this documentation.  All medical record entries made by the scribe were at my direction and in my presence.  I have reviewed the chart and discharge instructions (if applicable) and agree that the record reflects my personal performance and is accurate and complete. 10/30/2020  I,Lyric Barr-McArthur,acting as a scribe for Nance Pear, NP.,have documented all relevant documentation on the behalf of Nance Pear, NP,as directed by  Nance Pear, NP while in the presence of Nance Pear,  NP.  Nance Pear, NP

## 2020-10-30 NOTE — Assessment & Plan Note (Signed)
Clinically stable on synthroid. Continue current dose.   Lab Results  Component Value Date   TSH 0.88 07/27/2020

## 2020-10-30 NOTE — Addendum Note (Signed)
Addended by: Kelle Darting A on: 10/30/2020 03:28 PM   Modules accepted: Orders

## 2020-10-30 NOTE — Assessment & Plan Note (Signed)
New, advised pt to begin short course of meloxicam 7.5mg  once daily along with bid icing.  Call if symptoms worsen or if symptoms fail to improve in the next few weeks.

## 2020-10-30 NOTE — Addendum Note (Signed)
Addended by: Jiles Prows on: 10/30/2020 03:24 PM   Modules accepted: Orders

## 2020-10-30 NOTE — Patient Instructions (Signed)
Please complete lab work prior to leaving. Begin meloxicam once daily for heel pain. Call if heel pain worsens or if it does not improve.

## 2020-10-31 LAB — TSH: TSH: 2.09 u[IU]/mL (ref 0.35–5.50)

## 2020-11-05 ENCOUNTER — Other Ambulatory Visit (HOSPITAL_BASED_OUTPATIENT_CLINIC_OR_DEPARTMENT_OTHER): Payer: Self-pay

## 2020-11-05 ENCOUNTER — Ambulatory Visit (AMBULATORY_SURGERY_CENTER): Payer: Self-pay | Admitting: *Deleted

## 2020-11-05 ENCOUNTER — Other Ambulatory Visit (HOSPITAL_COMMUNITY): Payer: Self-pay

## 2020-11-05 ENCOUNTER — Other Ambulatory Visit: Payer: Self-pay

## 2020-11-05 VITALS — Ht 65.0 in | Wt 155.0 lb

## 2020-11-05 DIAGNOSIS — Z1211 Encounter for screening for malignant neoplasm of colon: Secondary | ICD-10-CM

## 2020-11-05 MED ORDER — MOVIPREP 100 G PO SOLR
1.0000 | Freq: Once | ORAL | 0 refills | Status: AC
  Filled 2020-11-05: qty 1, 1d supply, fill #0

## 2020-11-05 NOTE — Progress Notes (Signed)
No egg or soy allergy known to patient  No issues known to pt with past sedation with any surgeries or procedures Patient denies ever being told they had issues or difficulty with intubation  No FH of Malignant Hyperthermia Pt is not on diet pills Pt is not on  home 02  Pt is not on blood thinners  Pt denies issues with constipation being  CHRONIC- Occ issue  No A fib or A flutter  EMMI video to pt or via MyChart   Pt is fully vaccinated  for Covid   Due to the COVID-19 pandemic we are asking patients to follow certain guidelines.  Pt aware of COVID protocols and [place] guidelines   [place] language line interpreter used for [facility]- Husband attended [facility] but does not speak [place]

## 2020-11-06 ENCOUNTER — Encounter: Payer: Self-pay | Admitting: Gastroenterology

## 2020-11-06 ENCOUNTER — Other Ambulatory Visit (HOSPITAL_BASED_OUTPATIENT_CLINIC_OR_DEPARTMENT_OTHER): Payer: Self-pay

## 2020-11-07 ENCOUNTER — Other Ambulatory Visit (HOSPITAL_BASED_OUTPATIENT_CLINIC_OR_DEPARTMENT_OTHER): Payer: Self-pay

## 2020-11-18 ENCOUNTER — Encounter: Payer: Self-pay | Admitting: Certified Registered Nurse Anesthetist

## 2020-11-19 ENCOUNTER — Ambulatory Visit (AMBULATORY_SURGERY_CENTER): Payer: BC Managed Care – PPO | Admitting: Gastroenterology

## 2020-11-19 ENCOUNTER — Encounter: Payer: Self-pay | Admitting: Gastroenterology

## 2020-11-19 VITALS — BP 110/64 | HR 74 | Temp 98.0°F | Resp 16 | Ht 65.0 in | Wt 155.0 lb

## 2020-11-19 DIAGNOSIS — K635 Polyp of colon: Secondary | ICD-10-CM | POA: Diagnosis not present

## 2020-11-19 DIAGNOSIS — D124 Benign neoplasm of descending colon: Secondary | ICD-10-CM

## 2020-11-19 DIAGNOSIS — Z1211 Encounter for screening for malignant neoplasm of colon: Secondary | ICD-10-CM | POA: Diagnosis not present

## 2020-11-19 MED ORDER — SODIUM CHLORIDE 0.9 % IV SOLN
500.0000 mL | Freq: Once | INTRAVENOUS | Status: DC
Start: 2020-11-19 — End: 2022-02-21

## 2020-11-19 NOTE — Progress Notes (Signed)
   Referring Provider: Debbrah Alar, NP Primary Care Physician:  Debbrah Alar, NP  Reason for Procedure:  Colon cancer screening   IMPRESSION:  Need for colon cancer screening Appropriate candidate for monitored anesthesia care  PLAN: Colonoscopy in the [place] today   HPI: Amanda Roberson is a 50 y.o. female presents for screening colonoscopy.  No prior colonoscopy or colon cancer screening.  No baseline GI symptoms.   No known family history of colon cancer or polyps. No family history of uterine/endometrial cancer, pancreatic cancer or gastric/stomach cancer.   Past Medical History:  Diagnosis Date   Allergy    LAST YEAR 2021   Anemia    PAST HX   Arthritis    KNEE   Subclinical hypothyroidism     Past Surgical History:  Procedure Laterality Date   OOPHORECTOMY Left 2014    Current Outpatient Medications  Medication Sig Dispense Refill   ferrous sulfate 325 (65 FE) MG EC tablet Take 325 mg by mouth 3 (three) times daily with meals.     levothyroxine (SYNTHROID) 75 MCG tablet TAKE 1 TABLET BY MOUTH ONCE DAILY EXCEPT 1/2 TABLET ON SATURDAYS 90 tablet 1   meloxicam (MOBIC) 7.5 MG tablet Take 1 tablet (7.5 mg total) by mouth daily. 14 tablet 0   Multiple Vitamin (MULTIVITAMIN) tablet Take 1 tablet by mouth daily.     No current facility-administered medications for this visit.    Allergies as of 11/19/2020   (No Known Allergies)    Family History  Problem Relation Age of Onset   Breast cancer Sister        died at age 13   Colon polyps Neg Hx    Colon cancer Neg Hx    Esophageal cancer Neg Hx    Rectal cancer Neg Hx    Stomach cancer Neg Hx      Physical Exam: General:   Alert,  well-nourished, pleasant and cooperative in NAD Head:  Normocephalic and atraumatic. Eyes:  Sclera clear, no icterus.   Conjunctiva pink. Mouth:  No deformity or lesions.   Neck:  Supple; no masses or thyromegaly. Lungs:  Clear throughout to auscultation.   No  wheezes. Heart:  Regular rate and rhythm; no murmurs. Abdomen:  Soft, non-tender, nondistended, normal bowel sounds, no rebound or guarding.  Msk:  Symmetrical. No boney deformities LAD: No inguinal or umbilical LAD Extremities:  No clubbing or edema. Neurologic:  Alert and  oriented x4;  grossly nonfocal Skin:  No obvious rash or bruise. Psych:  Alert and cooperative. Normal mood and affect.    Zoe Creasman L. Tarri Glenn, MD, MPH 11/19/2020, 9:43 AM

## 2020-11-19 NOTE — Patient Instructions (Signed)
Thank you for allowing [place] to care for you today. We will call your contact person Wednesday morning to check on you.  Please call number that is highlighted if you have any questions or concerns before that time. You may eat normally today. Rest today, go back to your normal activities tomorrow.     YOU HAD AN ENDOSCOPIC PROCEDURE TODAY AT [facility]:   Refer to the procedure report that was given to you for any specific questions about what was found during the examination.  If the procedure report does not answer your questions, please call your gastroenterologist to clarify.  If you requested that your care partner not be given the details of your procedure findings, then the procedure report has been included in a sealed envelope for you to review at your convenience later.  YOU SHOULD EXPECT: Some feelings of bloating in the abdomen. Passage of more gas than usual.  Walking can help get rid of the air that was put into your GI tract during the procedure and reduce the bloating. If you had a lower endoscopy (such as a colonoscopy or flexible sigmoidoscopy) you may notice spotting of blood in your stool or on the toilet paper. If you underwent a bowel prep for your procedure, you may not have a normal bowel movement for a few days.  Please Note:  You might notice some irritation and congestion in your nose or some drainage.  This is from the oxygen used during your procedure.  There is no need for concern and it should clear up in a day or so.  SYMPTOMS TO REPORT IMMEDIATELY:  Following lower endoscopy (colonoscopy or flexible sigmoidoscopy):  Excessive amounts of blood in the stool  Significant tenderness or worsening of abdominal pains  Swelling of the abdomen that is new, acute  Fever of 100F or higher    For urgent or emergent issues, a gastroenterologist can be reached at any hour by calling [phone]. Do not use MyChart messaging for urgent concerns.     DIET:  We do recommend a small meal at first, but then you may proceed to your regular diet.  Drink plenty of fluids but you should avoid alcoholic beverages for 24 hours.  ACTIVITY:  You should plan to take it easy for the rest of today and you should NOT DRIVE or use heavy machinery until tomorrow (because of the sedation medicines used during the test).    FOLLOW UP: Our staff will call the number listed on your records 48-72 hours following your procedure to check on you and address any questions or concerns that you may have regarding the information given to you following your procedure. If we do not reach you, we will leave a message.  We will attempt to reach you two times.  During this call, we will ask if you have developed any symptoms of COVID 19. If you develop any symptoms (ie: fever, flu-like symptoms, shortness of breath, cough etc.) before then, please call [phone].  If you test positive for Covid 19 in the 2 weeks post procedure, please call and report this information to [place].    If any biopsies were taken you will be contacted by phone or by letter within the next 1-3 weeks.  Please call us at [phone] if you have not heard about the biopsies in 3 weeks.    SIGNATURES/CONFIDENTIALITY: You and/or your care partner have signed paperwork which will be entered into your electronic medical record.  These signatures attest to the fact that that the information above on your After Visit Summary has been reviewed and is understood.  Full responsibility of the confidentiality of this discharge information lies with you and/or your care-partner.  

## 2020-11-19 NOTE — Progress Notes (Addendum)
Pt's states no medical or surgical changes since previsit or office visit.  Language line used today at the [facility] for this pt.

## 2020-11-19 NOTE — Progress Notes (Signed)
Report given to PACU, vss 

## 2020-11-19 NOTE — Progress Notes (Signed)
Called to room to assist during endoscopic procedure.  Patient ID and intended procedure confirmed with present staff. Received instructions for my participation in the procedure from the performing physician.  

## 2020-11-19 NOTE — Op Note (Signed)
[facility] Patient Name: Amanda Roberson Procedure Date: 11/19/2020 10:20 AM MRN: [id number] Endoscopist: Thornton Park MD, MD Age: 50 Referring MD:  Date of Birth: 09-18-1970 Gender: Female Account #: [id number] Procedure:                Colonoscopy Indications:              Screening for colorectal malignant neoplasm, This                            is the patient's first colonoscopy                           No known family history of colon cancer or polyps Medicines:                Monitored Anesthesia Care Procedure:                Pre-Anesthesia Assessment:                           - Prior to the procedure, a History and Physical                            was performed, and patient medications and                            allergies were reviewed. The patient's tolerance of                            previous anesthesia was also reviewed. The risks                            and benefits of the procedure and the sedation                            options and risks were discussed with the patient.                            All questions were answered, and informed consent                            was obtained. Prior Anticoagulants: The patient has                            taken no previous anticoagulant or antiplatelet                            agents. ASA Grade Assessment: II - A patient with                            mild systemic disease. After reviewing the risks                            and benefits, the patient was deemed in  satisfactory condition to undergo the procedure.                           After obtaining informed consent, the colonoscope                            was passed under direct vision. Throughout the                            procedure, the patient's blood pressure, pulse, and                            oxygen saturations were monitored continuously. The                            CF [id number] #[id number] was  introduced through the anus                            and advanced to the 3 cm into the ileum. A second                            forward view of the right colon was performed. The                            colonoscopy was performed without difficulty. The                            patient tolerated the procedure well. The quality                            of the bowel preparation was good. The terminal                            ileum, ileocecal valve, appendiceal orifice, and                            rectum were photographed. Scope In: 11:14:20 AM Scope Out: 11:23:47 AM Scope Withdrawal Time: 0 hours 7 minutes 39 seconds  Total Procedure Duration: 0 hours 9 minutes 27 seconds  Findings:                 The perianal and digital rectal examinations were                            normal.                           A 2 mm possible polyp was found in the distal                            descending colon. The possible polyp was flat. The                            possiblepolyp was removed with a piecemeal  technique using a cold biopsy forceps. Resection                            and retrieval were complete. Estimated blood loss                            was minimal.                           The exam was otherwise without abnormality on                            direct and retroflexion views. Complications:            No immediate complications. Estimated Blood Loss:     Estimated blood loss: none. Impression:               - The entire examined colon is normal on direct and                            retroflexion views.                           - Small possible distal descending colon polyp.                            Removed. Recommendation:           - Patient has a contact number available for                            emergencies. The signs and symptoms of potential                            delayed complications were discussed with the                             patient. Return to normal activities tomorrow.                            Written discharge instructions were provided to the                            patient.                           - Resume previous diet.                           - Continue present medications.                           - Await pathology results to determine surveillance                            interval for next colonoscopy.                           -  Emerging evidence supports eating a diet of                            fruits, vegetables, grains, calcium, and yogurt                            while reducing red meat and alcohol may reduce the                            risk of colon cancer.                           - Thank you for allowing me to be involved in your                            colon cancer prevention. Thornton Park MD, MD 11/19/2020 11:28:21 AM This report has been signed electronically.

## 2020-11-19 NOTE — Progress Notes (Signed)
Patient to recovery, Language Line   Amanda Roberson [phone]) with video chat utilized. Patient expressed understanding and voiced no  concerns or pain.

## 2020-11-21 ENCOUNTER — Telehealth: Payer: Self-pay

## 2020-11-21 NOTE — Telephone Encounter (Signed)
  Follow up Call-  No flowsheet data found.   Patient questions:  Do you have a fever, pain , or abdominal swelling? No. Pain Score  0 *  Have you tolerated food without any problems? Yes.    Have you been able to return to your normal activities? Yes.    Do you have any questions about your discharge instructions: Diet   No. Medications  No. Follow up visit  No.  Do you have questions or concerns about your Care? No.  Actions: * If pain score is 4 or above: No action needed, pain <4.  Have you developed a fever since your procedure? no  2.   Have you had an respiratory symptoms (SOB or cough) since your procedure? no  3.   Have you tested positive for COVID 19 since your procedure no  4.   Have you had any family members/close contacts diagnosed with the COVID 19 since your procedure?  no   If yes to any of these questions please route to Joylene John, RN and Joella Prince, RN

## 2020-11-25 ENCOUNTER — Encounter: Payer: Self-pay | Admitting: Gastroenterology

## 2021-01-04 ENCOUNTER — Ambulatory Visit: Payer: BC Managed Care – PPO | Admitting: Family

## 2021-01-04 ENCOUNTER — Other Ambulatory Visit (HOSPITAL_BASED_OUTPATIENT_CLINIC_OR_DEPARTMENT_OTHER): Payer: Self-pay

## 2021-01-04 VITALS — BP 112/60 | HR 70 | Resp 18 | Ht 65.0 in | Wt 153.2 lb

## 2021-01-04 DIAGNOSIS — M722 Plantar fascial fibromatosis: Secondary | ICD-10-CM

## 2021-01-04 MED ORDER — MELOXICAM 7.5 MG PO TABS
7.5000 mg | ORAL_TABLET | Freq: Every day | ORAL | 0 refills | Status: DC
  Filled 2021-01-04: qty 14, 14d supply, fill #0

## 2021-01-04 NOTE — Assessment & Plan Note (Signed)
Recommended icing, meloxicam, good supportive shoes. Pt is advised to call if symptoms worsen or if symptoms do not improve and we will plan to refer to podiatry.

## 2021-01-04 NOTE — Patient Instructions (Signed)
Start meloxicam for heel pain. Ice your foot twice daily.  Call or send a mychart message if your symptoms are not improved in 2 weeks and we will send you to a foot doctor.

## 2021-01-04 NOTE — Progress Notes (Signed)
Subjective:   By signing my name below, I, Zite Okoli, attest that this documentation has been prepared under the direction and in the presence of Debbrah Alar, NP  01/04/2021    Patient ID: Amanda Roberson, female    DOB: Apr 12, 1970, 50 y.o.   MRN: [id number]  Chief Complaint  Patient presents with   Foot Pain    Right side, more painful after sitting a while, pain feels like a needle.     HPI Patient is in today for an office visit. She is accompanied by an interpreter ID- [id number]  Heel pain- She reports she has been having left heel pain for the past 6 months. At first she was able to walk and perform her activities but it has gradually gotten worse. She cant walk or stand for long periods of time. Adds that it is very painful when she gets up in the morning and when she stands up after siting down. She would also like a note for work that will allow her to sit down when in pain.   Past Medical History:  Diagnosis Date   Allergy    LAST YEAR 2021   Anemia    PAST HX   Arthritis    KNEE   Subclinical hypothyroidism     Past Surgical History:  Procedure Laterality Date   OOPHORECTOMY Left 2014    Family History  Problem Relation Age of Onset   Breast cancer Sister        died at age 54   Colon polyps Neg Hx    Colon cancer Neg Hx    Esophageal cancer Neg Hx    Rectal cancer Neg Hx    Stomach cancer Neg Hx     Social History   Socioeconomic History   Marital status: Married    Spouse name: Not on file   Number of children: Not on file   Years of education: Not on file   Highest education level: Not on file  Occupational History   Not on file  Tobacco Use   Smoking status: Never   Smokeless tobacco: Never  Substance and Sexual Activity   Alcohol use: Never   Drug use: Never   Sexual activity: Yes    Partners: Male  Other Topics Concern   Not on file  Social History Narrative   Moved from [place] 2019   Married   2 children in [place] (26 and 23)    Works at [employer]    Enjoys gardening/sewing   No pets   Completed 8th grade in [place] Determinants of Radio broadcast assistant Strain: Not on [employer] Insecurity: Not on file  Transportation Needs: Not on file  Physical Activity: Not on file  Stress: Not on file  Social Connections: Not on file  Intimate Partner Violence: Not on file    Outpatient Medications Prior to Visit  Medication Sig Dispense Refill   ferrous sulfate 325 (65 FE) MG EC tablet Take 325 mg by mouth 3 (three) times daily with meals.     levothyroxine (SYNTHROID) 75 MCG tablet TAKE 1 TABLET BY MOUTH ONCE DAILY EXCEPT 1/2 TABLET ON SATURDAYS 90 tablet 1   Multiple Vitamin (MULTIVITAMIN) tablet Take 1 tablet by mouth daily.     meloxicam (MOBIC) 7.5 MG tablet Take 1 tablet (7.5 mg total) by mouth daily. 14 tablet 0   Facility-Administered Medications Prior to Visit  Medication Dose Route Frequency Provider Last Rate Last Admin  0.9 %  sodium chloride infusion  500 mL Intravenous Once Thornton Park, MD        No Known Allergies  Review of Systems  Constitutional:  Negative for fever.  HENT:  Negative for ear pain and hearing loss.        (-)nystagmus (-)adenopathy  Eyes:  Negative for blurred vision.  Respiratory:  Negative for cough, shortness of breath and wheezing.   Cardiovascular:  Negative for chest pain and leg swelling.  Gastrointestinal:  Negative for blood in stool, diarrhea, nausea and vomiting.  Genitourinary:  Negative for dysuria and frequency.  Musculoskeletal:  Negative for joint pain and myalgias.       (+) left heel pain   Skin:  Negative for rash.  Neurological:  Negative for headaches.  Psychiatric/Behavioral:  Negative for depression. The patient is not nervous/anxious.       Objective:    Physical Exam Constitutional:      General: She is not in acute distress.    Appearance: Normal appearance. She is not ill-appearing.  HENT:     Head: Normocephalic and  atraumatic.     Right Ear: External ear normal.     Left Ear: External ear normal.  Eyes:     Extraocular Movements: Extraocular movements intact.     Pupils: Pupils are equal, round, and reactive to light.  Pulmonary:     Effort: Pulmonary effort is normal.  Abdominal:     General: Bowel sounds are normal. There is no distension.     Palpations: Abdomen is soft.     Tenderness: There is no abdominal tenderness. There is no guarding or rebound.  Musculoskeletal:     Comments: + tenderness to palpation of left heel, no swelling  Feet:     Comments: Left heel tender on palpation  Skin:    General: Skin is warm and dry.  Neurological:     Mental Status: She is alert and oriented to person, place, and time.  Psychiatric:        Behavior: Behavior normal.        Judgment: Judgment normal.    BP 112/60 (BP Location: Left Arm, Patient Position: Sitting, Cuff Size: Normal)   Pulse 70   Resp 18   Ht 5\' 5"  (1.651 m)   Wt 153 lb 3.2 oz (69.5 kg)   SpO2 99%   BMI 25.49 kg/m  Wt Readings from Last 3 Encounters:  01/04/21 153 lb 3.2 oz (69.5 kg)  11/19/20 155 lb (70.3 kg)  11/05/20 155 lb (70.3 kg)      Assessment & Plan:   Problem List Items Addressed This Visit       Unprioritized   Plantar fasciitis - Primary    Recommended icing, meloxicam, good supportive shoes. Pt is advised to call if symptoms worsen or if symptoms do not improve and we will plan to refer to podiatry.        Meds ordered this encounter  Medications   meloxicam (MOBIC) 7.5 MG tablet    Sig: Take 1 tablet (7.5 mg total) by mouth daily.    Dispense:  14 tablet    Refill:  0    Order Specific Question:   Supervising Provider    Answer:   Penni Homans A [[id number]]    I,Zite Okoli,acting as a scribe for Nance Pear, NP.,have documented all relevant documentation on the behalf of Nance Pear, NP,as directed by  Nance Pear, NP while in the presence of  Nance Pear, NP.    Cordella Register, NP , personally preformed the services described in this documentation.  All medical record entries made by the scribe were at my direction and in my presence.  I have reviewed the chart and discharge instructions (if applicable) and agree that the record reflects my personal performance and is accurate and complete. 01/04/2021

## 2021-02-04 ENCOUNTER — Other Ambulatory Visit (HOSPITAL_BASED_OUTPATIENT_CLINIC_OR_DEPARTMENT_OTHER): Payer: Self-pay

## 2021-04-13 IMAGING — MG DIGITAL SCREENING BILAT W/ TOMO W/ CAD
6 of 10 series · 6 of 30 positions shown · non-contrast
Comparison: None.

CLINICAL DATA: Screening.

EXAM:
DIGITAL SCREENING BILATERAL MAMMOGRAM WITH TOMO AND CAD

[L CC synth-2D]
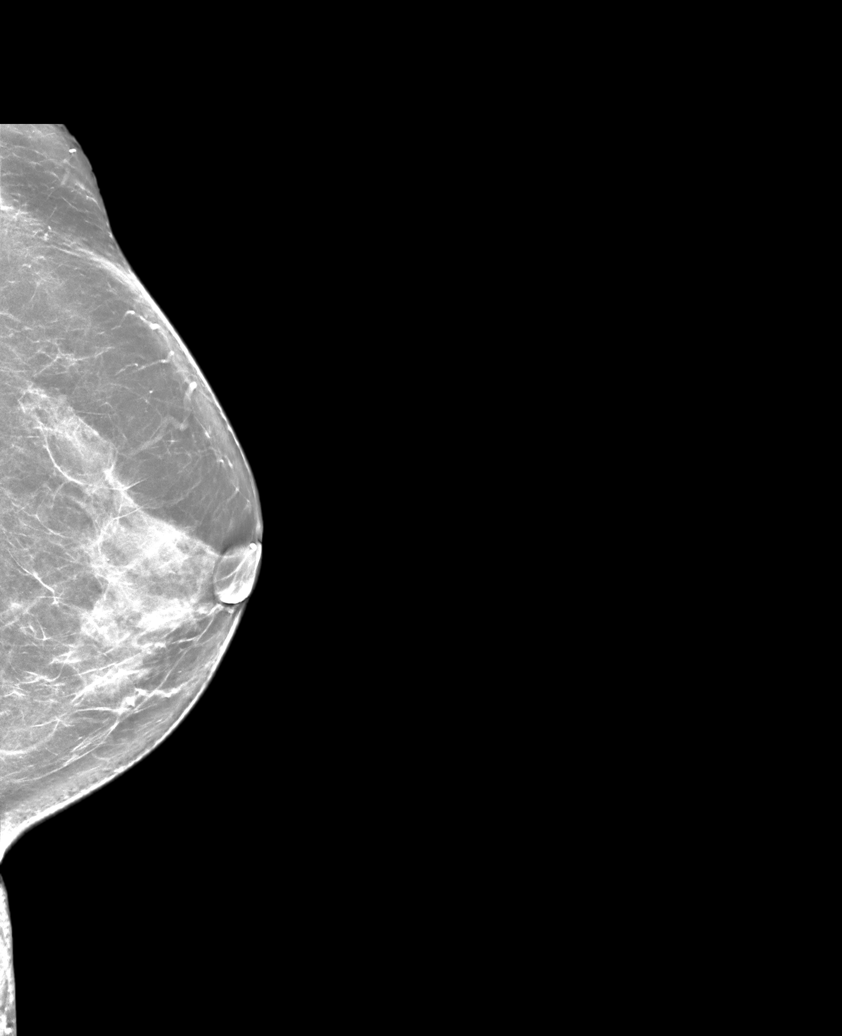

[R XCCL synth-2D]
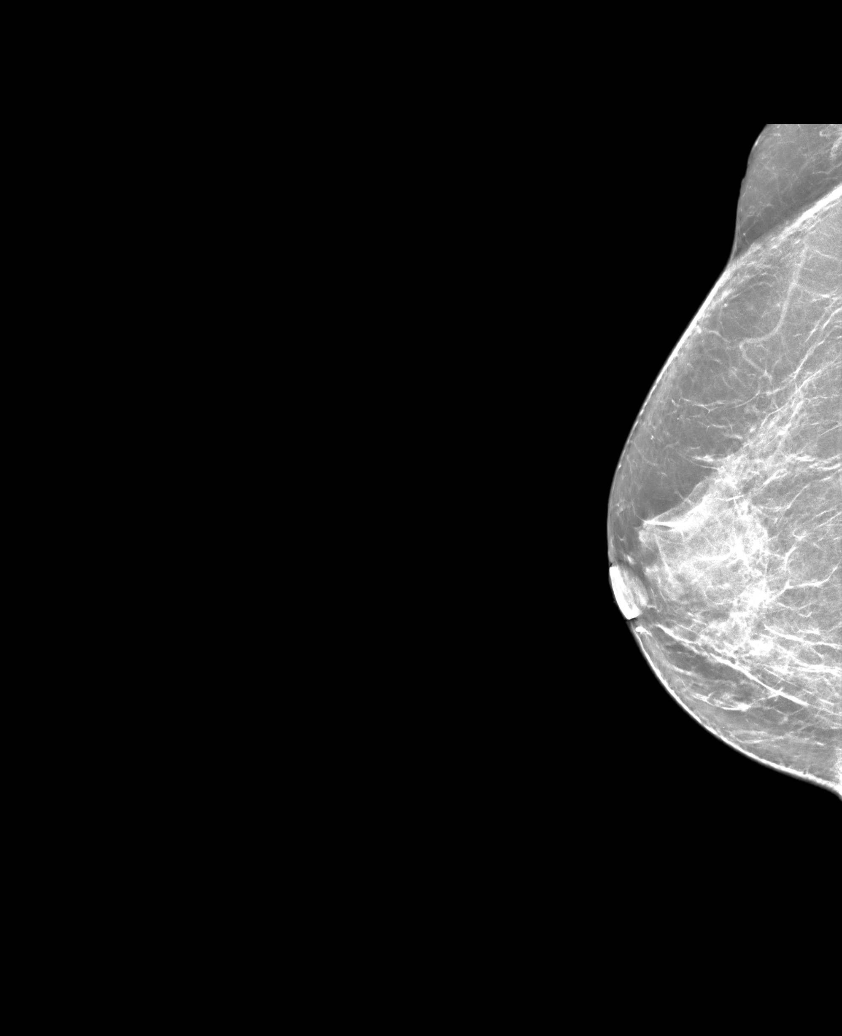

[R CC synth-2D]
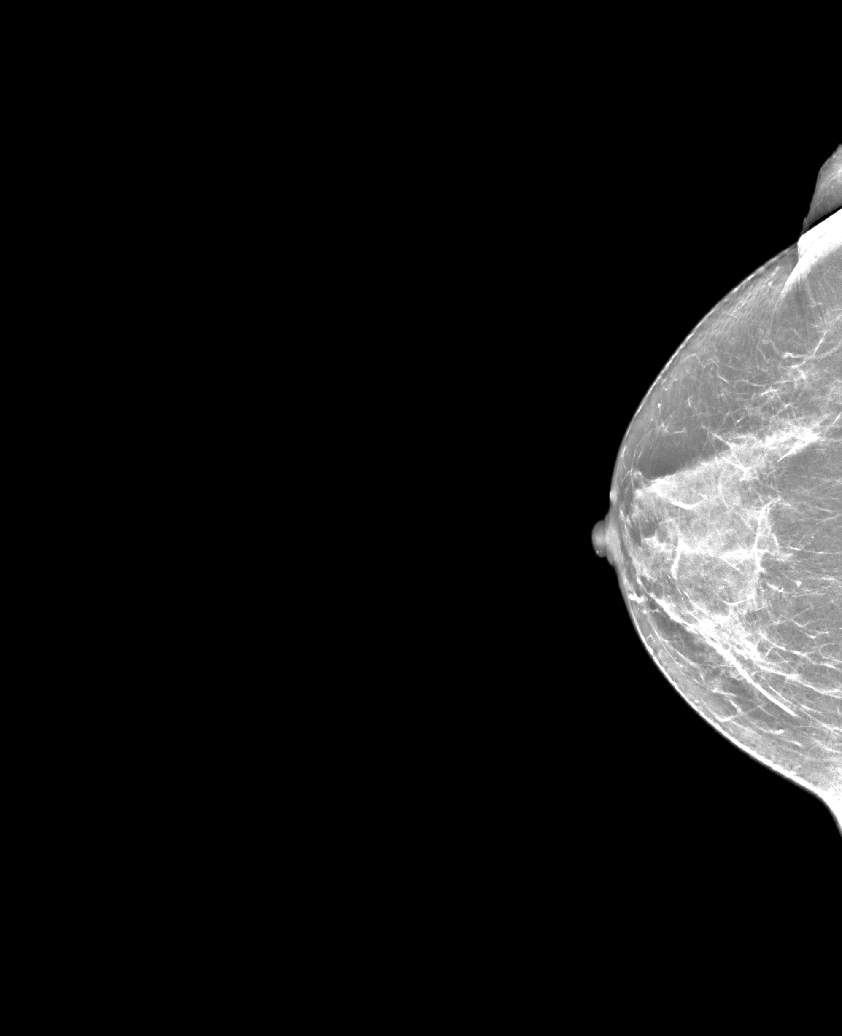

[L MLO synth-2D]
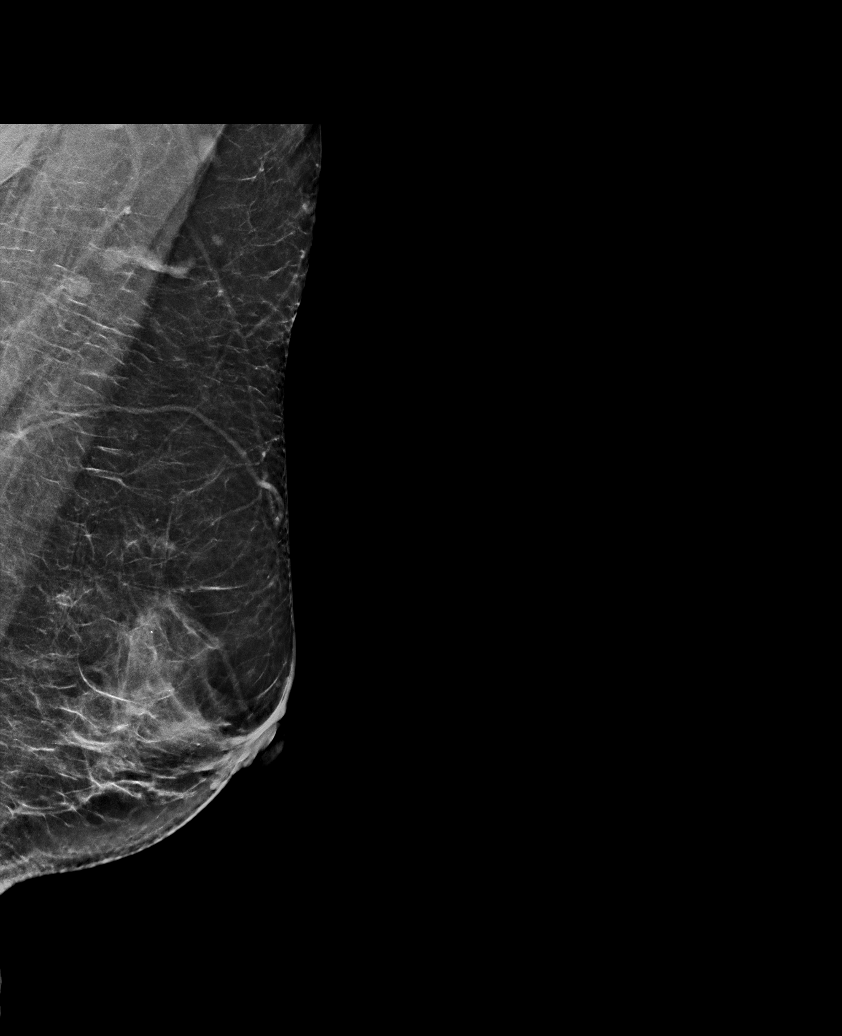

[R MLO synth-2D]
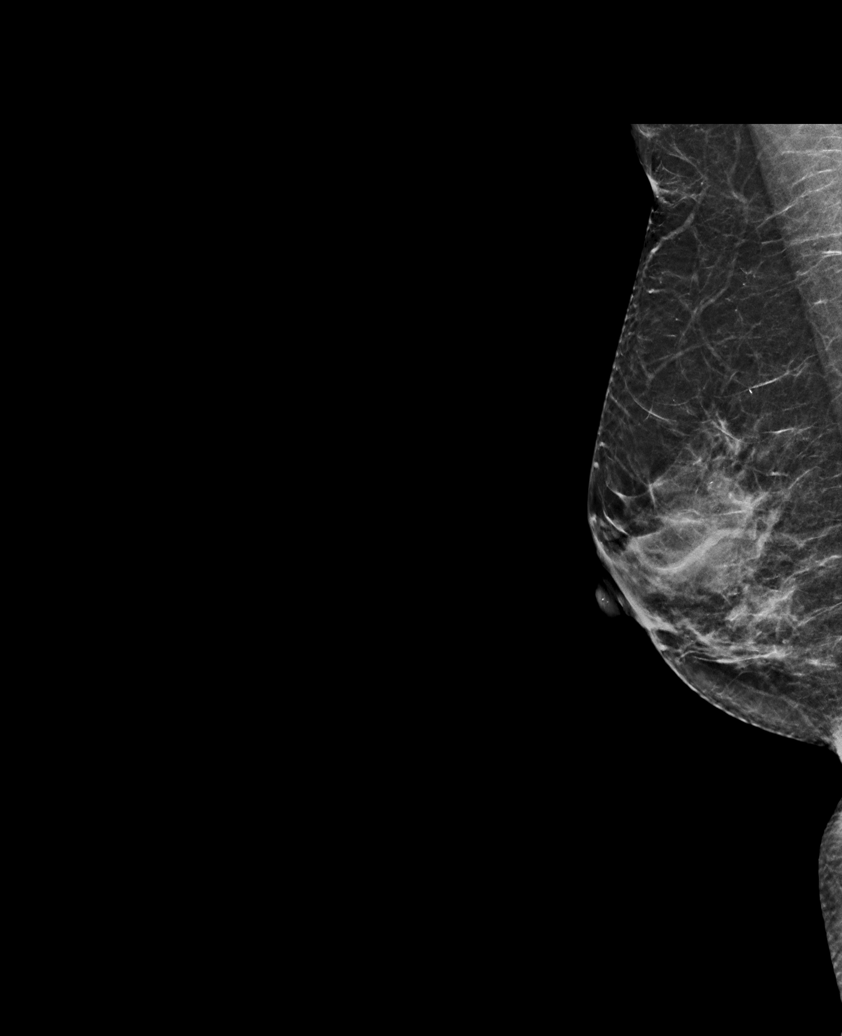

[R XCCL tomo · tomo slice 33/65.0]
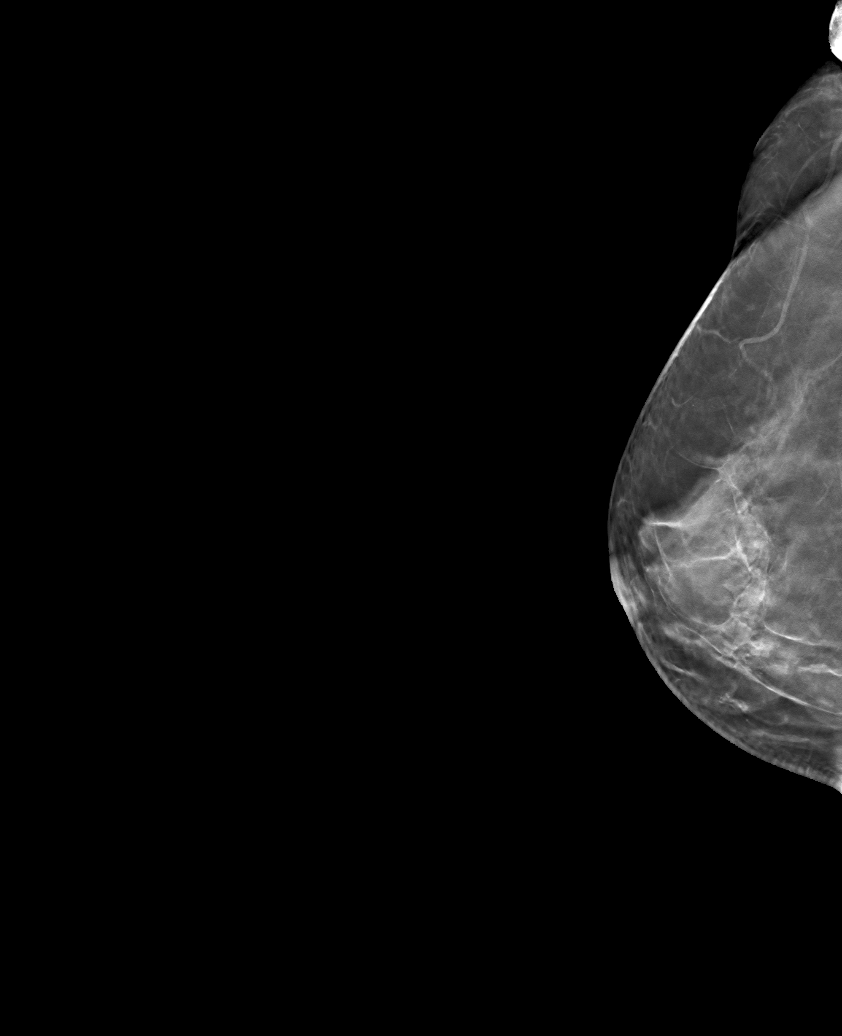

[6 of 30 positions shown; findings below may reference images not displayed]

ACR Breast Density Category c: The breast tissue is heterogeneously
dense, which may obscure small masses
FINDINGS: There are no findings suspicious for malignancy. Images were
processed with CAD.
IMPRESSION: No mammographic evidence of malignancy. A result letter of this
screening mammogram will be mailed directly to the patient.

RECOMMENDATION:
Screening mammogram in one year. (Code:EM-2-IHY)

BI-RADS CATEGORY  1: Negative.

## 2021-05-03 ENCOUNTER — Other Ambulatory Visit: Payer: Self-pay | Admitting: Family

## 2021-05-03 ENCOUNTER — Other Ambulatory Visit (HOSPITAL_BASED_OUTPATIENT_CLINIC_OR_DEPARTMENT_OTHER): Payer: Self-pay

## 2021-05-04 MED ORDER — LEVOTHYROXINE SODIUM 75 MCG PO TABS
ORAL_TABLET | ORAL | 1 refills | Status: DC
  Filled 2021-05-04: qty 90, fill #0

## 2021-05-06 ENCOUNTER — Other Ambulatory Visit (HOSPITAL_BASED_OUTPATIENT_CLINIC_OR_DEPARTMENT_OTHER): Payer: Self-pay

## 2021-05-14 ENCOUNTER — Encounter: Payer: Self-pay | Admitting: Family

## 2021-05-14 ENCOUNTER — Telehealth: Payer: Self-pay | Admitting: Family

## 2021-05-14 NOTE — Telephone Encounter (Signed)
Pt would like to know if she should keep taking levothyroxine and get refills when she is out. Please advise.  ?

## 2021-05-15 ENCOUNTER — Other Ambulatory Visit (HOSPITAL_BASED_OUTPATIENT_CLINIC_OR_DEPARTMENT_OTHER): Payer: Self-pay | Admitting: Family

## 2021-05-15 DIAGNOSIS — Z1231 Encounter for screening mammogram for malignant neoplasm of breast: Secondary | ICD-10-CM

## 2021-05-15 NOTE — Telephone Encounter (Signed)
See my chart message

## 2021-05-21 ENCOUNTER — Ambulatory Visit: Payer: BC Managed Care – PPO | Admitting: Family

## 2021-05-21 VITALS — BP 128/68 | HR 66 | Temp 98.5°F | Resp 16 | Wt 149.0 lb

## 2021-05-21 DIAGNOSIS — D509 Iron deficiency anemia, unspecified: Secondary | ICD-10-CM

## 2021-05-21 DIAGNOSIS — Z7185 Encounter for immunization safety counseling: Secondary | ICD-10-CM

## 2021-05-21 DIAGNOSIS — E039 Hypothyroidism, unspecified: Secondary | ICD-10-CM | POA: Diagnosis not present

## 2021-05-21 NOTE — Progress Notes (Signed)
? ?Subjective:  ? ?By signing my name below, I, Amanda Roberson, attest that this documentation has been prepared under the direction and in the presence of Debbrah Alar NP, 05/21/2021   ? ? Patient ID: Amanda Roberson, female    DOB: 31-Dec-1970, 51 y.o.   MRN: [id number] ? ?Chief Complaint  ?Patient presents with  ? Hypothyroidism  ?  Here for follow up  ? ? ?HPI ?Patient is in today for an office visit. Interpretor #[id number] ? ?Refill - She is requesting a refill of 75 MCG of Synthroid.  ? ?Thyroid - She is taking Synthroid. She ran out of her Synthroid medication on 05/21/2021. ?Lab Results  ?Component Value Date  ? TSH 2.09 10/30/2020  ? ?Iron - She ran out of her iron supplements. She was taking 325 MG EC tablets. ? ?Covid - 19 vaccine - She has not gotten the COVID - 19 bivalent vaccine.  ? ?Health Maintenance Due  ?Topic Date Due  ? COVID-19 Vaccine (3 - Booster for Janssen series) 05/11/2020  ? ? ?Past Medical History:  ?Diagnosis Date  ? Allergy   ? LAST YEAR 2021  ? Anemia   ? PAST HX  ? Arthritis   ? KNEE  ? Subclinical hypothyroidism   ? ? ?Past Surgical History:  ?Procedure Laterality Date  ? OOPHORECTOMY Left 2014  ? ? ?Family History  ?Problem Relation Age of Onset  ? Breast cancer Sister   ?     died at age 68  ? Colon polyps Neg Hx   ? Colon cancer Neg Hx   ? Esophageal cancer Neg Hx   ? Rectal cancer Neg Hx   ? Stomach cancer Neg Hx   ? ? ?Social History  ? ?Socioeconomic History  ? Marital status: Married  ?  Spouse name: Not on file  ? Number of children: Not on file  ? Years of education: Not on file  ? Highest education level: Not on file  ?Occupational History  ? Not on file  ?Tobacco Use  ? Smoking status: Never  ? Smokeless tobacco: Never  ?Substance and Sexual Activity  ? Alcohol use: Never  ? Drug use: Never  ? Sexual activity: Yes  ?  Partners: Male  ?Other Topics Concern  ? Not on file  ?Social History Narrative  ? Moved from [place] 2019  ? Married  ? 2 children in [place] (26 and 23)  ?  Works at [employer]   ? Enjoys gardening/sewing  ? No pets  ? Completed 8th grade in [place]  ? ?Social Determinants of Health  ? ?Financial Resource Strain: Not on file  ?Food Insecurity: Not on file  ?Transportation Needs: Not on file  ?Physical Activity: Not on file  ?Stress: Not on file  ?Social Connections: Not on file  ?Intimate Partner Violence: Not on file  ? ? ?Outpatient Medications Prior to Visit  ?Medication Sig Dispense Refill  ? ferrous sulfate 325 (65 FE) MG EC tablet Take 325 mg by mouth 3 (three) times daily with meals.    ? levothyroxine (SYNTHROID) 75 MCG tablet TAKE 1 TABLET BY MOUTH ONCE DAILY EXCEPT 1/2 TABLET ON SATURDAYS 90 tablet 1  ? Multiple Vitamin (MULTIVITAMIN) tablet Take 1 tablet by mouth daily.    ? meloxicam (MOBIC) 7.5 MG tablet Take 1 tablet (7.5 mg total) by mouth daily. (Patient not taking: Reported on 05/21/2021) 14 tablet 0  ? ?Facility-Administered Medications Prior to Visit  ?Medication Dose Route Frequency Provider Last Rate  Last Admin  ? 0.9 %  sodium chloride infusion  500 mL Intravenous Once Thornton Park, MD      ? ? ?No Known Allergies ? ?ROS ?See HPI ?   ?Objective:  ?  ?Physical Exam ?Constitutional:   ?   General: She is not in acute distress. ?   Appearance: Normal appearance. She is not ill-appearing.  ?HENT:  ?   Head: Normocephalic and atraumatic.  ?   Right Ear: External ear normal.  ?   Left Ear: External ear normal.  ?Eyes:  ?   Extraocular Movements: Extraocular movements intact.  ?   Pupils: Pupils are equal, round, and reactive to light.  ?Cardiovascular:  ?   Rate and Rhythm: Normal rate and regular rhythm.  ?   Heart sounds: Normal heart sounds. No murmur heard. ?  No gallop.  ?Pulmonary:  ?   Effort: Pulmonary effort is normal. No respiratory distress.  ?   Breath sounds: Normal breath sounds. No wheezing or rales.  ?Skin: ?   General: Skin is warm and dry.  ?Neurological:  ?   Mental Status: She is alert and oriented to person, place, and time.   ?Psychiatric:     ?   Mood and Affect: Mood normal.     ?   Behavior: Behavior normal.     ?   Judgment: Judgment normal.  ? ? ?BP 128/68 (BP Location: Right Arm, Patient Position: Sitting, Cuff Size: Small)   Pulse 66   Temp 98.5 ?F (36.9 ?C) (Oral)   Resp 16   Wt 149 lb (67.6 kg)   SpO2 99%   BMI 24.79 kg/m?  ?Wt Readings from Last 3 Encounters:  ?05/21/21 149 lb (67.6 kg)  ?01/04/21 153 lb 3.2 oz (69.5 kg)  ?11/19/20 155 lb (70.3 kg)  ? ? ?   ?Assessment & Plan:  ? ?Problem List Items Addressed This Visit   ? ?  ? Unprioritized  ? Iron deficiency anemia - Primary  ?  She has been out of her iron supplement x 1 month. Will check cbc and serum iron level.  ? ?She told me she prefers follow up communication via mychart in [place] and she will have a family member help her with interpreting the messages.  ? ?  ?  ? Relevant Orders  ? CBC with Differential/Platelet  ? Iron  ? Ferritin  ? Immunization counseling  ?  Recommended that she obtain the Bivalent Covid Booster at her local pharmacy.  ? ?  ?  ? Hypothyroid  ?  Clinically stable on synthroid 43mg. Continue same, obtain follow up tsh. ? ?  ?  ? Relevant Orders  ? TSH  ? ? ? ? ?No orders of the defined types were placed in this encounter. ? ? ?I, MNance Pear NP, personally preformed the services described in this documentation.  All medical record entries made by the scribe were at my direction and in my presence.  I have reviewed the chart and discharge instructions (if applicable) and agree that the record reflects my personal performance and is accurate and complete. 05/21/2021 ? ? ?I,Amber Collins,acting as a sEducation administratorfor [employer] NP.,have documented all relevant documentation on the behalf of MNance Pear NP,as directed by  MNance Pear NP while in the presence of MNance Pear NP. ? ? ? ?MNance Pear NP ? ?

## 2021-05-21 NOTE — Assessment & Plan Note (Signed)
She has been out of her iron supplement x 1 month. Will check cbc and serum iron level.  ? ?She told me she prefers follow up communication via mychart in [place] and she will have a family member help her with interpreting the messages.  ?

## 2021-05-21 NOTE — Assessment & Plan Note (Signed)
Clinically stable on synthroid 9mg. Continue same, obtain follow up tsh. ?

## 2021-05-21 NOTE — Assessment & Plan Note (Signed)
Recommended that she obtain the Bivalent Covid Booster at her local pharmacy.  ?

## 2021-05-22 ENCOUNTER — Other Ambulatory Visit (HOSPITAL_BASED_OUTPATIENT_CLINIC_OR_DEPARTMENT_OTHER): Payer: Self-pay

## 2021-05-22 ENCOUNTER — Telehealth: Payer: Self-pay | Admitting: Family

## 2021-05-22 ENCOUNTER — Other Ambulatory Visit: Payer: Self-pay | Admitting: Family

## 2021-05-22 LAB — CBC WITH DIFFERENTIAL/PLATELET
Basophils Absolute: 0.1 10*3/uL (ref 0.0–0.1)
Basophils Relative: 0.7 % (ref 0.0–3.0)
Eosinophils Absolute: 0.1 10*3/uL (ref 0.0–0.7)
Eosinophils Relative: 1.2 % (ref 0.0–5.0)
HCT: 35.4 % — ABNORMAL LOW (ref 36.0–46.0)
Hemoglobin: 11.6 g/dL — ABNORMAL LOW (ref 12.0–15.0)
Lymphocytes Relative: 27.9 % (ref 12.0–46.0)
Lymphs Abs: 2.1 10*3/uL (ref 0.7–4.0)
MCHC: 32.7 g/dL (ref 30.0–36.0)
MCV: 84.5 fl (ref 78.0–100.0)
Monocytes Absolute: 0.4 10*3/uL (ref 0.1–1.0)
Monocytes Relative: 5.8 % (ref 3.0–12.0)
Neutro Abs: 4.9 10*3/uL (ref 1.4–7.7)
Neutrophils Relative %: 64.4 % (ref 43.0–77.0)
Platelets: 134 10*3/uL — ABNORMAL LOW (ref 150.0–400.0)
RBC: 4.18 Mil/uL (ref 3.87–5.11)
RDW: 13.6 % (ref 11.5–15.5)
WBC: 7.5 10*3/uL (ref 4.0–10.5)

## 2021-05-22 LAB — TSH: TSH: 1.52 u[IU]/mL (ref 0.35–5.50)

## 2021-05-22 LAB — IRON: Iron: 35 ug/dL — ABNORMAL LOW (ref 42–145)

## 2021-05-22 LAB — FERRITIN: Ferritin: 25 ng/mL (ref 10.0–291.0)

## 2021-05-22 MED ORDER — LEVOTHYROXINE SODIUM 75 MCG PO TABS
ORAL_TABLET | ORAL | 1 refills | Status: DC
  Filled 2021-05-22: qty 84, 90d supply, fill #0

## 2021-05-22 NOTE — Telephone Encounter (Signed)
See mychart.  

## 2021-06-03 ENCOUNTER — Encounter (HOSPITAL_BASED_OUTPATIENT_CLINIC_OR_DEPARTMENT_OTHER): Payer: Self-pay

## 2021-06-03 ENCOUNTER — Ambulatory Visit (HOSPITAL_BASED_OUTPATIENT_CLINIC_OR_DEPARTMENT_OTHER)
Admission: RE | Admit: 2021-06-03 | Discharge: 2021-06-03 | Disposition: A | Discharge status: Home / Self Care | Payer: BC Managed Care – PPO | Source: Ambulatory Visit | Attending: Family | Admitting: Family

## 2021-06-03 DIAGNOSIS — Z1231 Encounter for screening mammogram for malignant neoplasm of breast: Secondary | ICD-10-CM | POA: Insufficient documentation

## 2021-06-12 ENCOUNTER — Ambulatory Visit (HOSPITAL_BASED_OUTPATIENT_CLINIC_OR_DEPARTMENT_OTHER)
Admission: RE | Admit: 2021-06-12 | Discharge: 2021-06-12 | Disposition: A | Discharge status: Home / Self Care | Payer: BC Managed Care – PPO | Source: Ambulatory Visit | Attending: Family | Admitting: Family

## 2021-06-12 ENCOUNTER — Encounter (HOSPITAL_BASED_OUTPATIENT_CLINIC_OR_DEPARTMENT_OTHER): Payer: Self-pay

## 2021-06-12 DIAGNOSIS — Z1231 Encounter for screening mammogram for malignant neoplasm of breast: Secondary | ICD-10-CM | POA: Insufficient documentation

## 2021-07-26 ENCOUNTER — Other Ambulatory Visit (HOSPITAL_BASED_OUTPATIENT_CLINIC_OR_DEPARTMENT_OTHER): Payer: Self-pay

## 2021-08-20 ENCOUNTER — Encounter: Payer: Self-pay | Admitting: Family

## 2021-08-20 ENCOUNTER — Other Ambulatory Visit (HOSPITAL_BASED_OUTPATIENT_CLINIC_OR_DEPARTMENT_OTHER): Payer: Self-pay

## 2021-08-20 ENCOUNTER — Ambulatory Visit (INDEPENDENT_AMBULATORY_CARE_PROVIDER_SITE_OTHER): Payer: BC Managed Care – PPO | Admitting: Family

## 2021-08-20 VITALS — BP 128/77 | HR 79 | Temp 98.3°F | Resp 16 | Ht 65.0 in | Wt 153.0 lb

## 2021-08-20 DIAGNOSIS — Z0000 Encounter for general adult medical examination without abnormal findings: Secondary | ICD-10-CM

## 2021-08-20 DIAGNOSIS — D509 Iron deficiency anemia, unspecified: Secondary | ICD-10-CM | POA: Diagnosis not present

## 2021-08-20 DIAGNOSIS — E039 Hypothyroidism, unspecified: Secondary | ICD-10-CM | POA: Diagnosis not present

## 2021-08-20 DIAGNOSIS — N393 Stress incontinence (female) (male): Secondary | ICD-10-CM

## 2021-08-20 DIAGNOSIS — Z7185 Encounter for immunization safety counseling: Secondary | ICD-10-CM

## 2021-08-20 DIAGNOSIS — M722 Plantar fascial fibromatosis: Secondary | ICD-10-CM

## 2021-08-20 MED ORDER — LEVOTHYROXINE SODIUM 75 MCG PO TABS
ORAL_TABLET | ORAL | 1 refills | Status: DC
  Filled 2021-08-20: qty 90, 90d supply, fill #0

## 2021-08-20 MED ORDER — MELOXICAM 15 MG PO TABS
15.0000 mg | ORAL_TABLET | Freq: Every day | ORAL | 0 refills | Status: DC | PRN
  Filled 2021-08-20: qty 14, 14d supply, fill #0

## 2021-08-20 NOTE — Assessment & Plan Note (Signed)
Encouraged pt to get bivalent covid booster at her pharmacy.

## 2021-08-20 NOTE — Assessment & Plan Note (Signed)
Tolerating iron every other day.  Will recheck iron levels.

## 2021-08-20 NOTE — Assessment & Plan Note (Signed)
Wt Readings from Last 3 Encounters:  08/20/21 153 lb (69.4 kg)  05/21/21 149 lb (67.6 kg)  01/04/21 153 lb 3.2 oz (69.5 kg)   Encouraged her to continue healthy diet and increased exercise to 30 minutes 5 days a week.

## 2021-08-20 NOTE — Patient Instructions (Addendum)
Please schedule a visit with the dentist.  Try doing Kegel exercises several times a day.   Roll your left foot on a frozen water bottle daily to help with pain and inflammation. You can also use meloxicam once daily as needed for foot pain short term.

## 2021-08-20 NOTE — Assessment & Plan Note (Signed)
Uncontrolled. Declines podiatry referral. Will give rx for meloxicam '15mg'$  daily short term. Recommended that she ice her left foot on water bottle at night.

## 2021-08-20 NOTE — Progress Notes (Signed)
Subjective:   By signing my name below, I, Kellie Simmering, attest that this documentation has been prepared under the direction and in the presence of Debbrah Alar, NP 08/20/2021.   Patient ID: Amanda Roberson, female    DOB: 05-24-1970, 51 y.o.   MRN: [id number]  Chief Complaint  Patient presents with  . Annual Exam         HPI  Patient is in today for a comprehensive physical exam. Translator [phone]  [place] translator used via video today.  She denies having any fever, new moles, congestion, sinus pain, sore throat, chest pain, palpitations, cough, shortness of breath, wheezing, nausea, vomiting, diarrhea, constipation, dysuria, abdominal pain, hematuria, new muscle pain, new joint pain, headaches.  Refill- She is requesting an alternative medication to help better manage her left heel pain. It was helpful before but pain has returned.   Urination- She reports having urinary frequency and incontinence. She chooses not to receive physical therapy to help manage her urinary incontinence. She states that she will instead do at-home Kegel exercises.  Leg cramping- She reports having cramping specific to the left leg sometimes. She states that she is also having pain in her left heel.   Ferrous Sulfate- She is currently taking Ferous Sulfate 325 mg and reports that it has not affected her urination.   Menstruation- She reports that she has not had a menstrual cycle in 4 years.   Family history: She reports no changes to her family medical history.  Social history: She is currently works in [employer] division for a [employer].  Immunizations: She is UTD on her tetanus immunizations. She is due for a shingles immunization in 11/2021. She has received 2 [employer] COVID-19 vaccinations.  Diet: She maintains a healthy diet. Exercise: She is exercising sometimes.  Colonoscopy: Last completed on 11/19/2020.  - The entire examined colon was normal on direct and  retroflexion views. - Small possible distal descending colon polyp. Removed. Pap Smear: Last completed on 06/05/2020. Results were normal. Mammogram: Last completed on 06/13/2021. No mammographic evidence of malignancy. Repeat in 1 year. Dental: She has not had a dental visit this year. She reports have teeth sensitivity after her last teeth cleaning.  Vision: She has not had her vision examined this year.  Past Medical History:  Diagnosis Date  . Allergy    LAST YEAR 2021  . Anemia    PAST HX  . Arthritis    KNEE  . Subclinical hypothyroidism     Past Surgical History:  Procedure Laterality Date  . OOPHORECTOMY Left 2014    Family History  Problem Relation Age of Onset  . Breast cancer Sister        died at age 12  . Colon polyps Neg Hx   . Colon cancer Neg Hx   . Esophageal cancer Neg Hx   . Rectal cancer Neg Hx   . Stomach cancer Neg Hx     Social History   Socioeconomic History  . Marital status: Married    Spouse name: Not on file  . Number of children: Not on file  . Years of education: Not on file  . Highest education level: Not on file  Occupational History  . Not on file  Tobacco Use  . Smoking status: Never  . Smokeless tobacco: Never  Substance and Sexual Activity  . Alcohol use: Never  . Drug use: Never  . Sexual activity: Yes    Partners: Male  Other Topics Concern  . 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 ferrous sulfate  325 (65 FE) MG EC tablet Take 325 mg by mouth every other day.    . Multiple Vitamin (MULTIVITAMIN) tablet Take 1 tablet by mouth daily.    Marland Kitchen levothyroxine (SYNTHROID) 75 MCG tablet TAKE 1 TABLET BY MOUTH ONCE DAILY EXCEPT 1/2 TABLET ON SATURDAYS 90 tablet 1   Facility-Administered Medications Prior to Visit  Medication Dose Route Frequency Provider Last Rate Last Admin  . 0.9 %  sodium chloride infusion  500 mL Intravenous Once Thornton Park, MD        No Known Allergies  Review of Systems  Constitutional:  Negative for chills and fever.  HENT:  Negative for congestion, sinus pain and sore throat.   Respiratory:  Negative for cough, sputum production, shortness of breath and wheezing.   Cardiovascular:  Negative for leg swelling.  Gastrointestinal:  Negative for abdominal pain, constipation, diarrhea, nausea and vomiting.  Genitourinary:  Positive for frequency. Negative for dysuria, hematuria and urgency.       (+) incontinence   Musculoskeletal:  Negative for myalgias.  Skin:  Negative for itching and rash.       (-) new moles  Neurological:  Negative for headaches.  Psychiatric/Behavioral:  Negative for depression.        Objective:    Physical Exam Constitutional:      General: She is not in acute distress.    Appearance: Normal appearance. She is not ill-appearing.  HENT:     Head: Normocephalic and atraumatic.     Right Ear: Tympanic membrane, ear canal and external ear normal.     Left Ear: Tympanic membrane, ear canal and external ear normal.  Eyes:     Extraocular Movements: Extraocular movements intact.     Right eye: No nystagmus.     Left eye: No nystagmus.     Pupils: Pupils are equal, round, and reactive to light.  Cardiovascular:     Rate and Rhythm: Normal rate and regular rhythm.     Pulses: Normal pulses.     Heart sounds: Normal heart sounds. No murmur heard.    No gallop.  Pulmonary:     Effort: Pulmonary effort is normal. No respiratory  distress.     Breath sounds: Normal breath sounds. No wheezing or rales.  Abdominal:     General: Bowel sounds are normal.     Palpations: Abdomen is soft.     Tenderness: There is no abdominal tenderness. There is no guarding.  Musculoskeletal:     Comments: Muscle strength 5/5 on upper and lower extremities  Skin:    General: Skin is warm and dry.  Neurological:     Mental Status: She is alert and oriented to person, place, and time.     Deep Tendon Reflexes:  Reflex Scores:      Patellar reflexes are 2+ on the right side and 2+ on the left side. Psychiatric:        Mood and Affect: Mood normal.        Behavior: Behavior normal.        Judgment: Judgment normal.    BP 128/77 (BP Location: Right Arm, Patient Position: Sitting, Cuff Size: Small)   Pulse 79   Temp 98.3 F (36.8 C) (Oral)   Resp 16   Ht '5\' 5"'$  (1.651 m)   Wt 153 lb (69.4 kg)   SpO2 100%   BMI 25.46 kg/m  Wt Readings from Last 3 Encounters:  08/20/21 153 lb (69.4 kg)  05/21/21 149 lb (67.6 kg)  01/04/21 153 lb 3.2 oz (69.5 kg)      Assessment & Plan:   Problem List Items Addressed This Visit       Unprioritized   Stress incontinence of urine    Discussed referral for pelvic floor PT but she does not have transptoration and it is hard to get time off from work.  Reviewed Kegel exercises and how to do them. Recommended that she do several sets of 15 each day.       Preventative health care - Primary    Wt Readings from Last 3 Encounters:  08/20/21 153 lb (69.4 kg)  05/21/21 149 lb (67.6 kg)  01/04/21 153 lb 3.2 oz (69.5 kg)  Encouraged her to continue healthy diet and increased exercise to 30 minutes 5 days a week.       Plantar fasciitis    Uncontrolled. Declines podiatry referral. Will give rx for meloxicam '15mg'$  daily short term. Recommended that she ice her left foot on water bottle at night.       Iron deficiency anemia    Tolerating iron every other day.  Will recheck iron levels.        Relevant Orders   CBC with Differential/Platelet   Iron, TIBC and Ferritin Panel   Immunization counseling    Encouraged pt to get bivalent covid booster at her pharmacy.       Hypothyroid    Last TSH was wnl. Continue current dose of synthroid.   Lab Results  Component Value Date   TSH 1.52 05/21/2021        Relevant Medications   levothyroxine (SYNTHROID) 75 MCG tablet   Other Relevant Orders   TSH   Meds ordered this encounter  Medications  . meloxicam (MOBIC) 15 MG tablet    Sig: Take 1 tablet (15 mg total) by mouth daily as needed for pain.    Dispense:  14 tablet    Refill:  0    Order Specific Question:   Supervising Provider    Answer:   Penni Homans A [[id number]]  . levothyroxine (SYNTHROID) 75 MCG tablet    Sig: TAKE 1 TABLET BY MOUTH ONCE DAILY EXCEPT 1/2 TABLET ON SATURDAYS    Dispense:  90 tablet    Refill:  1    Order Specific Question:   Supervising Provider    Answer:   Penni Homans A [[id number]]    I, Nance Pear, NP, personally preformed the services described in this documentation.  All medical record entries made by the scribe were at my direction and in my presence.  I have reviewed the chart and discharge instructions (if applicable) and agree that the record reflects my personal performance and is accurate and complete. 08/20/2021.  I,Mohammed Iqbal,acting  as a Education administrator for Nance Pear, NP.,have documented all relevant documentation on the behalf of Nance Pear, NP,as directed by  Nance Pear, NP while in the presence of Nance Pear, NP.  Nance Pear, NP

## 2021-08-20 NOTE — Assessment & Plan Note (Signed)
Last TSH was wnl. Continue current dose of synthroid.   Lab Results  Component Value Date   TSH 1.52 05/21/2021

## 2021-08-20 NOTE — Assessment & Plan Note (Signed)
Discussed referral for pelvic floor PT but she does not have transptoration and it is hard to get time off from work.  Reviewed Kegel exercises and how to do them. Recommended that she do several sets of 15 each day.

## 2021-08-21 ENCOUNTER — Other Ambulatory Visit (HOSPITAL_BASED_OUTPATIENT_CLINIC_OR_DEPARTMENT_OTHER): Payer: Self-pay

## 2021-08-21 ENCOUNTER — Telehealth: Payer: Self-pay | Admitting: Family

## 2021-08-21 LAB — CBC WITH DIFFERENTIAL/PLATELET
Basophils Absolute: 0.1 10*3/uL (ref 0.0–0.1)
Basophils Relative: 0.8 % (ref 0.0–3.0)
Eosinophils Absolute: 0.1 10*3/uL (ref 0.0–0.7)
Eosinophils Relative: 1.7 % (ref 0.0–5.0)
HCT: 37.4 % (ref 36.0–46.0)
Hemoglobin: 12.3 g/dL (ref 12.0–15.0)
Lymphocytes Relative: 27.8 % (ref 12.0–46.0)
Lymphs Abs: 1.9 10*3/uL (ref 0.7–4.0)
MCHC: 32.9 g/dL (ref 30.0–36.0)
MCV: 84 fl (ref 78.0–100.0)
Monocytes Absolute: 0.4 10*3/uL (ref 0.1–1.0)
Monocytes Relative: 5.2 % (ref 3.0–12.0)
Neutro Abs: 4.4 10*3/uL (ref 1.4–7.7)
Neutrophils Relative %: 64.5 % (ref 43.0–77.0)
Platelets: 137 10*3/uL — ABNORMAL LOW (ref 150.0–400.0)
RBC: 4.45 Mil/uL (ref 3.87–5.11)
RDW: 13.6 % (ref 11.5–15.5)
WBC: 6.8 10*3/uL (ref 4.0–10.5)

## 2021-08-21 LAB — IRON,TIBC AND FERRITIN PANEL
%SAT: 16 % (calc) (ref 16–45)
Ferritin: 47 ng/mL (ref 16–232)
Iron: 52 ug/dL (ref 40–190)
TIBC: 331 mcg/dL (calc) (ref 250–450)

## 2021-08-21 LAB — TSH: TSH: 13.51 u[IU]/mL — ABNORMAL HIGH (ref 0.35–5.50)

## 2021-08-21 MED ORDER — LEVOTHYROXINE SODIUM 100 MCG PO TABS
100.0000 ug | ORAL_TABLET | Freq: Every day | ORAL | 0 refills | Status: DC
  Filled 2021-08-21: qty 90, 90d supply, fill #0

## 2021-08-21 NOTE — Telephone Encounter (Signed)
Lab work shows that thyroid medication is not strong enough.  I would like her to change synthroid to 23mg once daily and repeat TSH in 6 weeks. Take on an empty stomach in AM- wait 30 minutes before other meds/food.   Iron level looks good. Please continue current dose of iron.   Also, I forgot to collect a urine at the time of her visit. If she is concerned that she has an infection, we can have her return to the lab for ua/culture- my apologies.

## 2021-08-21 NOTE — Telephone Encounter (Signed)
Called but no answer lvm for patient to call back

## 2021-08-22 NOTE — Telephone Encounter (Signed)
Lvm again today 

## 2021-08-23 ENCOUNTER — Other Ambulatory Visit (HOSPITAL_BASED_OUTPATIENT_CLINIC_OR_DEPARTMENT_OTHER): Payer: Self-pay

## 2021-08-26 ENCOUNTER — Other Ambulatory Visit (HOSPITAL_BASED_OUTPATIENT_CLINIC_OR_DEPARTMENT_OTHER): Payer: Self-pay

## 2021-08-26 NOTE — Telephone Encounter (Signed)
Per pharmacist patient has not picked up medication. Letter was created sent to her mychart and to her home address

## 2021-08-29 ENCOUNTER — Other Ambulatory Visit (HOSPITAL_BASED_OUTPATIENT_CLINIC_OR_DEPARTMENT_OTHER): Payer: Self-pay

## 2021-09-02 ENCOUNTER — Other Ambulatory Visit (HOSPITAL_BASED_OUTPATIENT_CLINIC_OR_DEPARTMENT_OTHER): Payer: Self-pay

## 2021-10-23 DIAGNOSIS — S335XXA Sprain of ligaments of lumbar spine, initial encounter: Secondary | ICD-10-CM | POA: Diagnosis not present

## 2021-11-12 ENCOUNTER — Encounter: Payer: Self-pay | Admitting: Family

## 2021-11-12 ENCOUNTER — Ambulatory Visit: Payer: BC Managed Care – PPO | Admitting: Family

## 2021-11-12 ENCOUNTER — Other Ambulatory Visit (HOSPITAL_BASED_OUTPATIENT_CLINIC_OR_DEPARTMENT_OTHER): Payer: Self-pay

## 2021-11-12 ENCOUNTER — Telehealth: Payer: Self-pay | Admitting: Family

## 2021-11-12 VITALS — BP 127/69 | HR 78 | Temp 98.4°F | Resp 16 | Wt 148.0 lb

## 2021-11-12 DIAGNOSIS — Z23 Encounter for immunization: Secondary | ICD-10-CM | POA: Diagnosis not present

## 2021-11-12 DIAGNOSIS — D509 Iron deficiency anemia, unspecified: Secondary | ICD-10-CM

## 2021-11-12 DIAGNOSIS — E039 Hypothyroidism, unspecified: Secondary | ICD-10-CM | POA: Diagnosis not present

## 2021-11-12 DIAGNOSIS — M7989 Other specified soft tissue disorders: Secondary | ICD-10-CM | POA: Diagnosis not present

## 2021-11-12 MED ORDER — FERROUS SULFATE 325 (65 FE) MG PO TBEC
325.0000 mg | DELAYED_RELEASE_TABLET | ORAL | 4 refills | Status: DC
  Filled 2021-11-12: qty 45, 90d supply, fill #0
  Filled 2022-01-31: qty 45, 90d supply, fill #1

## 2021-11-12 MED ORDER — LEVOTHYROXINE SODIUM 100 MCG PO TABS
100.0000 ug | ORAL_TABLET | Freq: Every day | ORAL | 0 refills | Status: DC
  Filled 2021-11-12: qty 90, 90d supply, fill #0

## 2021-11-12 NOTE — Patient Instructions (Signed)
Please complete lab work prior to leaving. We will contact you about scheduling your ultrasound.

## 2021-11-12 NOTE — Assessment & Plan Note (Signed)
Lab Results  Component Value Date   WBC 6.8 08/20/2021   HGB 12.3 08/20/2021   HCT 37.4 08/20/2021   MCV 84.0 08/20/2021   PLT 137.0 (L) 08/20/2021   Normal H/H over the summer. Continue iron '325mg'$  PO QOD.

## 2021-11-12 NOTE — Progress Notes (Signed)
Subjective:   By signing my name below, I, [name], attest that this documentation has been prepared under the direction and in the presence of Karie Chimera, NP 11/12/2021   Patient ID: Amanda Roberson, female    DOB: May 13, 1970, 51 y.o.   MRN: [id number]  Chief Complaint  Patient presents with   Leg Swelling    Complains of swelling left knee and lower leg    HPI Patient is in today for an office visit. #[id number] (utilized burmese interpretor via video)  Refill: She is requesting a refill of 325 (65 FE) of Ferrous Sulfate and 100 Mcg of Synthroid.   Left Leg Pain and Swelling: She complains of pain in her left leg and swelling that occurred a few months ago. She notes that there is weakness in her left leg as well. When she is working or standing for longer periods of time, she notices that is primarily when symptoms occur. She denies of any recent long trips or a family history of blood clots. She also denies of any injury, chest pain or SOB. She is requesting for her Greig Castilla to be contacted for an ultrasound at [phone]  Immunizations: She is interested in an influenza vaccine during today's visit.  Health Maintenance Due  Topic Date Due   COVID-19 Vaccine (3 - Booster for [employer] series) 05/11/2020    Past Medical History:  Diagnosis Date   Allergy    LAST YEAR 2021   Anemia    PAST HX   Arthritis    KNEE   Subclinical hypothyroidism     Past Surgical History:  Procedure Laterality Date   OOPHORECTOMY Left 2014    Family History  Problem Relation Age of Onset   Breast cancer Sister        died at age 103   Colon polyps Neg Hx    Colon cancer Neg Hx    Esophageal cancer Neg Hx    Rectal cancer Neg Hx    Stomach cancer Neg Hx     Social History   Socioeconomic History   Marital status: Married    Spouse name: Not on file   Number of children: Not on file   Years of education: Not on file   Highest education level: Not on file   Occupational History   Not on file  Tobacco Use   Smoking status: Never   Smokeless tobacco: Never  Substance and Sexual Activity   Alcohol use: Never   Drug use: Never   Sexual activity: Yes    Partners: Male  Other Topics Concern   Not on file  Social History Narrative   Moved from [place] 2019   Married   2 children in [place] (26 and 23)   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 14 tablet 0   Multiple Vitamin (MULTIVITAMIN) tablet Take 1 tablet by mouth daily.  ferrous sulfate 325 (65 FE) MG EC tablet Take 325 mg by mouth every other day.     levothyroxine (SYNTHROID) 100 MCG tablet Take 1 tablet (100 mcg total) by mouth daily. 90 tablet 0   Facility-Administered Medications Prior to Visit  Medication Dose Route Frequency Provider Last Rate Last Admin   0.9 %  sodium chloride infusion  500 mL Intravenous Once Thornton [place], MD        No Known Allergies  Review of Systems  Musculoskeletal:  Positive for myalgias (Left Leg).       (+) Swelling (Left Leg)       Objective:    Physical Exam Constitutional:      General: She is not in acute distress.    Appearance: Normal appearance. She is not ill-appearing.  HENT:     Head: Normocephalic and atraumatic.     Right Ear: External ear normal.     Left Ear: External ear normal.  Eyes:     Extraocular Movements: Extraocular movements intact.     Pupils: Pupils are equal, round, and reactive to light.   Cardiovascular:     Rate and Rhythm: Normal rate and regular rhythm.     Heart sounds: Normal heart sounds. No murmur heard.    No gallop.  Pulmonary:     Effort: Pulmonary effort is normal. No respiratory distress.     Breath sounds: Normal breath sounds. No wheezing or rales.  Musculoskeletal:     Comments: Mild left calf swelling (Left 37 cm, Right 36 cm)  Skin:    General: Skin is warm and dry.  Neurological:     Mental Status: She is alert and oriented to person, place, and time.  Psychiatric:        Mood and Affect: Mood normal.        Behavior: Behavior normal.        Judgment: Judgment normal.     BP 127/69 (BP Location: Right Arm, Patient Position: Sitting, Cuff Size: Small)   Pulse 78   Temp 98.4 F (36.9 C) (Oral)   Resp 16   Wt 148 lb (67.1 kg)   SpO2 99%   BMI 24.63 kg/m  Wt Readings from Last 3 Encounters:  11/12/21 148 lb (67.1 kg)  08/20/21 153 lb (69.4 kg)  05/21/21 149 lb (67.6 kg)       Assessment & Plan:   Problem List Items Addressed This Visit       Unprioritized   Leg swelling - Primary    I suspect that her symptoms are due to venous insufficiency.  My suspicion for DVT is low, however will obtain a bilateral LE doppler to confirm. Recommended that she wear compression socks when working on her feet to help prevent pain/swelling.       Relevant Orders   US Venous Img Lower Bilateral (DVT)   Iron deficiency anemia    Lab Results  Component Value Date   WBC 6.8 08/20/2021   HGB 12.3 08/20/2021   HCT 37.4 08/20/2021   MCV 84.0 08/20/2021   PLT 137.0 (L) 08/20/2021  Normal H/H over the summer. Continue iron '325mg'$  PO QOD.       Relevant Medications   ferrous sulfate 325 (65 FE) MG EC tablet   Hypothyroid    Clinically stable on synthroid- continue same. Obtain follow up TSH.       Relevant Medications   levothyroxine (SYNTHROID) 100 MCG tablet   Other Relevant Orders   TSH  Other Visit Diagnoses     Needs flu shot        Relevant Orders   Flu Vaccine QUAD 6+ mos PF IM (Fluarix Quad PF) (Completed)      Meds ordered this encounter  Medications   levothyroxine (SYNTHROID) 100 MCG tablet    Sig: Take 1 tablet (100 mcg total) by mouth daily.    Dispense:  90 tablet    Refill:  0    Order Specific Question:   Supervising Provider    Answer:   Penni Homans A [[id number]]   ferrous sulfate 325 (65 FE) MG EC tablet    Sig: Take 1 tablet (325 mg total) by mouth every other day.    Dispense:  45 tablet    Refill:  4    Order Specific Question:   Supervising Provider    Answer:   Penni Homans A [[id number]]    I, Nance Pear, NP, personally preformed the services described in this documentation.  All medical record entries made by the scribe were at my direction and in my presence.  I have reviewed the chart and discharge instructions (if applicable) and agree that the record reflects my personal performance and is accurate and complete. 11/12/2021   I,Amber Collins,acting as a scribe for Nance Pear, NP.,have documented all relevant documentation on the behalf of Nance Pear, NP,as directed by  Nance Pear, NP while in the presence of Nance Pear, NP.    Nance Pear, NP

## 2021-11-12 NOTE — Telephone Encounter (Signed)
Please call pt's church friend with time for LE doppler.  I believe her name is Amanda Roberson:  [phone] .

## 2021-11-12 NOTE — Assessment & Plan Note (Signed)
I suspect that her symptoms are due to venous insufficiency.  My suspicion for DVT is low, however will obtain a bilateral LE doppler to confirm. Recommended that she wear compression socks when working on her feet to help prevent pain/swelling.

## 2021-11-12 NOTE — Assessment & Plan Note (Signed)
Clinically stable on synthroid- continue same. Obtain follow up TSH.

## 2021-11-13 ENCOUNTER — Ambulatory Visit (HOSPITAL_BASED_OUTPATIENT_CLINIC_OR_DEPARTMENT_OTHER)
Admission: RE | Admit: 2021-11-13 | Discharge: 2021-11-13 | Disposition: A | Discharge status: Home / Self Care | Payer: BC Managed Care – PPO | Source: Ambulatory Visit | Attending: Family | Admitting: Family

## 2021-11-13 DIAGNOSIS — M7989 Other specified soft tissue disorders: Secondary | ICD-10-CM | POA: Insufficient documentation

## 2021-11-13 DIAGNOSIS — R600 Localized edema: Secondary | ICD-10-CM | POA: Diagnosis not present

## 2021-11-13 LAB — TSH: TSH: 1.8 u[IU]/mL (ref 0.35–5.50)

## 2021-11-13 NOTE — Telephone Encounter (Signed)
Patient scheduled for today at 1 pm, information given to her friend. He wrote information down for her and will let her know of appointment time and to be there 15 minutes prior to scheduled time.

## 2022-01-31 ENCOUNTER — Other Ambulatory Visit (HOSPITAL_BASED_OUTPATIENT_CLINIC_OR_DEPARTMENT_OTHER): Payer: Self-pay

## 2022-01-31 MED ORDER — COMIRNATY 30 MCG/0.3ML IM SUSY
PREFILLED_SYRINGE | INTRAMUSCULAR | 0 refills | Status: DC
Start: 2022-01-31 — End: 2022-02-21
  Filled 2022-01-31: qty 0.3, 1d supply, fill #0

## 2022-02-10 ENCOUNTER — Other Ambulatory Visit: Payer: Self-pay | Admitting: Family

## 2022-02-10 ENCOUNTER — Other Ambulatory Visit (HOSPITAL_BASED_OUTPATIENT_CLINIC_OR_DEPARTMENT_OTHER): Payer: Self-pay

## 2022-02-10 DIAGNOSIS — E039 Hypothyroidism, unspecified: Secondary | ICD-10-CM

## 2022-02-10 MED ORDER — LEVOTHYROXINE SODIUM 100 MCG PO TABS
100.0000 ug | ORAL_TABLET | Freq: Every day | ORAL | 0 refills | Status: DC
  Filled 2022-02-10: qty 90, 90d supply, fill #0

## 2022-02-11 ENCOUNTER — Other Ambulatory Visit (HOSPITAL_BASED_OUTPATIENT_CLINIC_OR_DEPARTMENT_OTHER): Payer: Self-pay

## 2022-02-21 ENCOUNTER — Ambulatory Visit: Payer: BC Managed Care – PPO | Admitting: Family

## 2022-02-21 ENCOUNTER — Encounter: Payer: Self-pay | Admitting: Family

## 2022-02-21 ENCOUNTER — Other Ambulatory Visit (HOSPITAL_BASED_OUTPATIENT_CLINIC_OR_DEPARTMENT_OTHER): Payer: Self-pay

## 2022-02-21 VITALS — BP 119/62 | HR 70 | Temp 98.7°F | Resp 16 | Wt 150.0 lb

## 2022-02-21 DIAGNOSIS — M25562 Pain in left knee: Secondary | ICD-10-CM | POA: Diagnosis not present

## 2022-02-21 DIAGNOSIS — G8929 Other chronic pain: Secondary | ICD-10-CM | POA: Diagnosis not present

## 2022-02-21 DIAGNOSIS — E039 Hypothyroidism, unspecified: Secondary | ICD-10-CM

## 2022-02-21 DIAGNOSIS — Z23 Encounter for immunization: Secondary | ICD-10-CM

## 2022-02-21 DIAGNOSIS — D509 Iron deficiency anemia, unspecified: Secondary | ICD-10-CM

## 2022-02-21 MED ORDER — MELOXICAM 15 MG PO TABS
15.0000 mg | ORAL_TABLET | Freq: Every day | ORAL | 1 refills | Status: DC | PRN
  Filled 2022-02-21: qty 30, 30d supply, fill #0

## 2022-02-21 NOTE — Assessment & Plan Note (Signed)
Chronic. Reports improvement with prn meloxicam. Continue same. Declines referral to orthopedics.

## 2022-02-21 NOTE — Assessment & Plan Note (Signed)
Reports good compliance with iron '325mg'$  every other day.

## 2022-02-21 NOTE — Progress Notes (Signed)
Subjective:   By signing my name below, I, Amanda Roberson, attest that this documentation has been prepared under the direction and in the presence of Amanda Alar, NP. 02/21/2022   Patient ID: Amanda Roberson, female    DOB: 05-10-1970, 52 y.o.   MRN: [id number]  Chief Complaint  Patient presents with   Hypothyroidism    Here for follow up    HPI Patient is in today for a follow up visit.   She is present with a virtual interpretor [id number].   Knee pain: She continues having left knee pain since last year. She has mild relief while taking 15 mg meloxicam. She has never seen a specialist to manage her knee pian. Notes that her knee sometimes swells so she wears a compression sleeve on the left knee.  Iron: She is taking 325 mg iron supplements every other day.   Thyroid: She continues taking 100 mcg synthroid daily PO and reports no new issues while taking it.  Immunizations: She has the latest Covid-19 booster vaccine. She is eligible for the shingles vaccines and is interested in receiving it during this visit. Flu shot is up to date.    Past Medical History:  Diagnosis Date   Allergy    LAST YEAR 2021   Anemia    PAST HX   Arthritis    KNEE   Subclinical hypothyroidism     Past Surgical History:  Procedure Laterality Date   OOPHORECTOMY Left 2014    Family History  Problem Relation Age of Onset   Breast cancer Sister        died at age 37   Colon polyps Neg Hx    Colon cancer Neg Hx    Esophageal cancer Neg Hx    Rectal cancer Neg Hx    Stomach cancer Neg Hx     Social History   Socioeconomic History   Marital status: Married    Spouse name: Not on file   Number of children: Not on file   Years of education: Not on file   Highest education level: Not on file  Occupational History   Not on file  Tobacco Use   Smoking status: Never   Smokeless tobacco: Never  Substance and Sexual Activity   Alcohol use: Never   Drug use: Never   Sexual  activity: Yes    Partners: Male  Other Topics Concern   Not on file  Social History Narrative   Moved from [place] 2019   Married   2 children in [place] (26 and 23)   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 45 tablet 4   levothyroxine (SYNTHROID) 100 MCG tablet Take 1 tablet (100 mcg total) by mouth daily. 90 tablet 0   Multiple Vitamin (MULTIVITAMIN) tablet Take 1 tablet by mouth daily.     COVID-19 mRNA vaccine 2023-2024 (COMIRNATY) syringe Inject into the muscle. 0.3 mL  0   meloxicam (MOBIC) 15 MG tablet Take 1 tablet (15 mg total) by mouth daily as needed for pain. 14 tablet 0   0.9 %  sodium chloride infusion      No facility-administered medications prior to visit.    No Known Allergies  Review of Systems  Musculoskeletal:        (+)left knee pain       Objective:    Physical Exam Constitutional:      General: She is not in acute distress.    Appearance: Normal appearance. She is not ill-appearing.  HENT:     Head: Normocephalic and atraumatic.     Right Ear: External ear normal.     Left Ear: External ear normal.  Eyes:     Extraocular Movements: Extraocular movements intact.     Pupils: Pupils are equal, round, and reactive to light.  Cardiovascular:     Rate and Rhythm: Normal rate and regular rhythm.     Heart sounds: Normal heart sounds. No murmur heard.    No gallop.  Pulmonary:     Effort: Pulmonary effort is normal. No respiratory distress.     Breath  sounds: Normal breath sounds. No wheezing or rales.  Skin:    General: Skin is warm and dry.  Neurological:     Mental Status: She is alert and oriented to person, place, and time.  Psychiatric:        Judgment: Judgment normal.     BP 119/62 (BP Location: Right Arm, Patient Position: Sitting, Cuff Size: Small)   Pulse 70   Temp 98.7 F (37.1 C) (Oral)   Resp 16   Wt 150 lb (68 kg)   SpO2 99%   BMI 24.96 kg/m  Wt Readings from Last 3 Encounters:  02/21/22 150 lb (68 kg)  11/12/21 148 lb (67.1 kg)  08/20/21 153 lb (69.4 kg)       Assessment & Plan:  Iron deficiency anemia, unspecified iron deficiency anemia type Assessment & Plan: Reports good compliance with iron '325mg'$  every other day.    Orders: -     CBC with Differential/Platelet -     Iron, TIBC and Ferritin Panel  Hypothyroidism, unspecified type Assessment & Plan: Stable on current dose of synthroid.  Obtain follow up tsh.   Orders: -     TSH  Chronic pain of left knee Assessment & Plan: Chronic. Reports improvement with prn meloxicam. Continue same. Declines referral to orthopedics.    Other orders -     Meloxicam; Take 1 tablet (15 mg total) by mouth daily as needed for pain.  Dispense: 30 tablet; Refill: 1    I, Nance Pear, NP, personally preformed the services described in this documentation.  All medical record entries made by the scribe were at my direction and in my presence.  I have reviewed the chart and discharge instructions (if applicable) and agree that the record reflects my personal performance and is accurate and complete. 02/21/2022   I,Amanda Roberson,acting as a scribe for Nance Pear, NP.,have documented all relevant documentation on the behalf of Nance Pear, NP,as directed by  Nance Pear, NP while in the presence of Nance Pear, NP.   Nance Pear, NP

## 2022-02-21 NOTE — Assessment & Plan Note (Signed)
Stable on current dose of synthroid.  Obtain follow up tsh.

## 2022-02-22 LAB — CBC WITH DIFFERENTIAL/PLATELET
Absolute Monocytes: 360 cells/uL (ref 200–950)
Basophils Absolute: 41 cells/uL (ref 0–200)
Basophils Relative: 0.7 %
Eosinophils Absolute: 70 cells/uL (ref 15–500)
Eosinophils Relative: 1.2 %
HCT: 36 % (ref 35.0–45.0)
Hemoglobin: 12 g/dL (ref 11.7–15.5)
Lymphs Abs: 2529 cells/uL (ref 850–3900)
MCH: 28.1 pg (ref 27.0–33.0)
MCHC: 33.3 g/dL (ref 32.0–36.0)
MCV: 84.3 fL (ref 80.0–100.0)
Monocytes Relative: 6.2 %
Neutro Abs: 2801 cells/uL (ref 1500–7800)
Neutrophils Relative %: 48.3 %
RBC: 4.27 10*6/uL (ref 3.80–5.10)
RDW: 12.5 % (ref 11.0–15.0)
Total Lymphocyte: 43.6 %
WBC: 5.8 10*3/uL (ref 3.8–10.8)

## 2022-02-22 LAB — IRON,TIBC AND FERRITIN PANEL
%SAT: 20 % (calc) (ref 16–45)
Ferritin: 77 ng/mL (ref 16–232)
Iron: 62 ug/dL (ref 45–160)
TIBC: 304 mcg/dL (calc) (ref 250–450)

## 2022-02-22 LAB — TSH: TSH: 0.1 mIU/L — ABNORMAL LOW

## 2022-02-23 ENCOUNTER — Telehealth: Payer: Self-pay | Admitting: Family

## 2022-02-23 DIAGNOSIS — E039 Hypothyroidism, unspecified: Secondary | ICD-10-CM

## 2022-02-23 MED ORDER — LEVOTHYROXINE SODIUM 88 MCG PO TABS
88.0000 ug | ORAL_TABLET | Freq: Every day | ORAL | 1 refills | Status: DC
  Filled 2022-02-23: qty 90, 90d supply, fill #0

## 2022-02-23 NOTE — Telephone Encounter (Signed)
Please advise pt that her synthroid should be decreased based on her lab results. I would like to change it to 88 mcg and have her repeat tsh in 6 weeks.

## 2022-02-24 ENCOUNTER — Other Ambulatory Visit (HOSPITAL_BASED_OUTPATIENT_CLINIC_OR_DEPARTMENT_OTHER): Payer: Self-pay

## 2022-03-05 ENCOUNTER — Other Ambulatory Visit (HOSPITAL_BASED_OUTPATIENT_CLINIC_OR_DEPARTMENT_OTHER): Payer: Self-pay

## 2022-05-20 ENCOUNTER — Telehealth: Payer: Self-pay | Admitting: *Deleted

## 2022-05-20 ENCOUNTER — Ambulatory Visit (INDEPENDENT_AMBULATORY_CARE_PROVIDER_SITE_OTHER): Payer: BC Managed Care – PPO | Admitting: *Deleted

## 2022-05-20 ENCOUNTER — Other Ambulatory Visit (INDEPENDENT_AMBULATORY_CARE_PROVIDER_SITE_OTHER): Payer: BC Managed Care – PPO

## 2022-05-20 DIAGNOSIS — Z23 Encounter for immunization: Secondary | ICD-10-CM

## 2022-05-20 DIAGNOSIS — E039 Hypothyroidism, unspecified: Secondary | ICD-10-CM

## 2022-05-20 NOTE — Progress Notes (Signed)
Patient here for second shingles vaccine.  Vaccine given in left deltoid and patient tolerated well.  

## 2022-05-20 NOTE — Telephone Encounter (Signed)
Patient was in for nurse visit but had a question about if she should keep taking the thyroid medication and in reviewing her chart I noticed that she never got the message to decrease her dose.  Per Efraim Kaufmann we will recheck her tsh first and call her with results and what dose that will be called in.  Advised her to look out for our phone call.

## 2022-05-21 LAB — TSH: TSH: 0.83 u[IU]/mL (ref 0.35–5.50)

## 2022-05-23 ENCOUNTER — Telehealth: Payer: Self-pay | Admitting: Family

## 2022-05-23 MED ORDER — FERROUS SULFATE 325 (65 FE) MG PO TBEC: 325.0000 mg | DELAYED_RELEASE_TABLET | ORAL | 4 refills | Status: DC

## 2022-05-23 NOTE — Telephone Encounter (Signed)
Medication: ferrous sulfate 325 (65 FE) MG EC tablet  Has the patient contacted their pharmacy? No.   Preferred Pharmacy:  [facility] [address] [place], [place] [address]

## 2022-05-23 NOTE — Telephone Encounter (Signed)
Refill sent.

## 2022-06-24 ENCOUNTER — Other Ambulatory Visit: Payer: BC Managed Care – PPO

## 2022-09-05 ENCOUNTER — Encounter: Payer: BC Managed Care – PPO | Admitting: Family

## 2022-09-05 ENCOUNTER — Ambulatory Visit (INDEPENDENT_AMBULATORY_CARE_PROVIDER_SITE_OTHER): Payer: BC Managed Care – PPO | Admitting: Family

## 2022-09-05 ENCOUNTER — Encounter: Payer: Self-pay | Admitting: Family

## 2022-09-05 VITALS — BP 116/67 | HR 72 | Temp 97.9°F | Resp 16 | Wt 156.0 lb

## 2022-09-05 DIAGNOSIS — Z0000 Encounter for general adult medical examination without abnormal findings: Secondary | ICD-10-CM | POA: Diagnosis not present

## 2022-09-05 DIAGNOSIS — E039 Hypothyroidism, unspecified: Secondary | ICD-10-CM

## 2022-09-05 DIAGNOSIS — Z1231 Encounter for screening mammogram for malignant neoplasm of breast: Secondary | ICD-10-CM

## 2022-09-05 MED ORDER — LEVOTHYROXINE SODIUM 88 MCG PO TABS: 88.0000 ug | ORAL_TABLET | Freq: Every day | ORAL | 1 refills | Status: DC

## 2022-09-05 NOTE — Assessment & Plan Note (Signed)
Restart synthroid, repeat TSH in 6 weeks.

## 2022-09-05 NOTE — Progress Notes (Signed)
Subjective:     Patient ID: Amanda Roberson, female    DOB: 1970-04-03, 52 y.o.   MRN: [id number]  Chief Complaint  Patient presents with   Hypothyroidism    Here for follow up    HPI    Burmese video interpretor used for today's visit: Mimi  #[id number] History of Present Illness         Patient presents today for complete physical.  Immunizations: up to date  Diet: doing Wt Readings from Last 3 Encounters:  09/05/22 156 lb (70.8 kg)  02/21/22 150 lb (68 kg)  11/12/21 148 lb (67.1 kg)  Exercise: not regularly  Colonoscopy: 10/22 Pap Smear: due 5/25 Mammogram: due Vision: up to date Dental:  due  Hypothyroid- not taking synthroid x 1 month got confused.       Health Maintenance Due  Topic Date Due   INFLUENZA VACCINE  09/04/2022    Past Medical History:  Diagnosis Date   Allergy    LAST YEAR 2021   Anemia    PAST HX   Arthritis    KNEE   Subclinical hypothyroidism     Past Surgical History:  Procedure Laterality Date   OOPHORECTOMY Left 2014    Family History  Problem Relation Age of Onset   Breast cancer Sister        died at age 44   Colon polyps Neg Hx    Colon cancer Neg Hx    Esophageal cancer Neg Hx    Rectal cancer Neg Hx    Stomach cancer Neg Hx     Social History   Socioeconomic History   Marital status: Married    Spouse name: Not on file   Number of children: Not on file   Years of education: Not on file   Highest education level: Not on file  Occupational History   Not on file  Tobacco Use   Smoking status: Never   Smokeless tobacco: Never  Substance and Sexual Activity   Alcohol use: Never   Drug use: Never   Sexual activity: Yes    Partners: Male  Other Topics Concern   Not on file  Social History Narrative   Moved from [place] 2019   Married   2 children in [place] (26 and 23)   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 45 tablet 4   levothyroxine (SYNTHROID) 88 MCG tablet Take 1 tablet (88 mcg total) by mouth daily. (Patient not taking: Reported on 09/05/2022) 90 tablet 1   meloxicam (MOBIC) 15 MG tablet Take 1 tablet (15 mg total) by mouth daily as needed for pain. (Patient not taking: Reported on 09/05/2022) 30 tablet 1   Multiple Vitamin (MULTIVITAMIN) tablet Take 1 tablet by mouth daily. (Patient not taking: Reported on 09/05/2022)     No facility-administered medications prior to visit.    No Known Allergies  Review of Systems  Constitutional:  Negative for weight loss.  HENT:  Negative for congestion and hearing loss.   Eyes:  Negative for blurred vision.  Respiratory:  Negative for cough.   Cardiovascular:  Negative for leg swelling.  Gastrointestinal:  Negative for constipation and diarrhea.  Genitourinary:  Negative for dysuria and frequency.  Musculoskeletal:  Negative for joint pain and myalgias.  Skin:  Negative for rash.  Neurological:  Negative for headaches.  Psychiatric/Behavioral:         Declines anxiety/depression       Lab Results  Component Value Date   CHOL 164 06/05/2020   HDL 44.60 06/05/2020   LDLCALC 101 (H) 06/05/2020   TRIG 89.0 06/05/2020   CHOLHDL 4 06/05/2020    Objective:    Physical Exam   BP 116/67 (BP Location: Right Arm, Patient Position: Sitting, Cuff Size: Small)   Pulse 72   Temp 97.9 F (36.6 C) (Oral)   Resp 16   Wt 156 lb (70.8 kg)   SpO2 99%   BMI 25.96 kg/m  Wt Readings from Last 3 Encounters:  09/05/22 156 lb (70.8 kg)   02/21/22 150 lb (68 kg)  11/12/21 148 lb (67.1 kg)     Physical Exam  Constitutional: She is oriented to person, place, and time. She appears well-developed and well-nourished. No distress.  HENT:  Head: Normocephalic and atraumatic.  Right Ear: Tympanic membrane and ear canal normal.  Left Ear: Tympanic membrane and ear canal normal.  Mouth/Throat: Oropharynx is clear and moist.  Eyes: Pupils are equal, round, and reactive to light. No scleral icterus.  Neck: Normal range of motion. No thyromegaly present.  Cardiovascular: Normal rate and regular rhythm.   No murmur heard. Pulmonary/Chest: Effort normal and breath sounds normal. No respiratory distress. He has no wheezes. She has no rales. She exhibits no tenderness.  Abdominal: Soft. Bowel sounds are normal. She exhibits no distension and no mass. There is no tenderness. There is no rebound and no guarding.  Musculoskeletal: She exhibits no edema.  Lymphadenopathy:    She has no cervical adenopathy.  Neurological: She is alert and oriented to person, place, and time. She has normal patellar reflexes. She exhibits normal muscle tone. Coordination normal.  Skin: Skin is warm and dry.  Psychiatric: She has a normal mood and affect. Her behavior is normal. Judgment and thought content normal.  Breast/Pelvic: deferred       Assessment & Plan:    Assessment & Plan:   Problem List Items Addressed This Visit       Unprioritized   Preventative health care    Continue healthy diet and regular exercise. Scheduled mammogram.  Colo up to date.  Recommended flu and covid shots in the fall.       Hypothyroid    Restart synthroid, repeat TSH in 6 weeks.       Relevant Medications   levothyroxine (SYNTHROID) 88 MCG tablet   Other Visit Diagnoses     Breast cancer screening by mammogram    -  Primary   Relevant Orders   MM 3D SCREENING MAMMOGRAM BILATERAL BREAST       I have discontinued Saki W. Gaddie's multivitamin and  meloxicam. I am also having her maintain her ferrous sulfate and levothyroxine.  Meds ordered this encounter  Medications   levothyroxine (SYNTHROID) 88 MCG tablet    Sig: Take 1 tablet (88 mcg total) by mouth daily.    Dispense:  90 tablet    Refill:  1  Order Specific Question:   Supervising Provider    Answer:   Danise Edge A [[id number]]

## 2022-09-05 NOTE — Assessment & Plan Note (Signed)
Continue healthy diet and regular exercise. Scheduled mammogram.  Colo up to date.  Recommended flu and covid shots in the fall.

## 2022-09-11 ENCOUNTER — Ambulatory Visit (HOSPITAL_BASED_OUTPATIENT_CLINIC_OR_DEPARTMENT_OTHER)
Admission: RE | Admit: 2022-09-11 | Discharge: 2022-09-11 | Disposition: A | Discharge status: Home / Self Care | Payer: BC Managed Care – PPO | Source: Ambulatory Visit | Attending: Family | Admitting: Family

## 2022-09-11 ENCOUNTER — Encounter (HOSPITAL_BASED_OUTPATIENT_CLINIC_OR_DEPARTMENT_OTHER): Payer: Self-pay

## 2022-09-11 DIAGNOSIS — Z1231 Encounter for screening mammogram for malignant neoplasm of breast: Secondary | ICD-10-CM | POA: Insufficient documentation

## 2022-10-21 ENCOUNTER — Other Ambulatory Visit (INDEPENDENT_AMBULATORY_CARE_PROVIDER_SITE_OTHER): Payer: BC Managed Care – PPO

## 2022-10-21 DIAGNOSIS — E039 Hypothyroidism, unspecified: Secondary | ICD-10-CM | POA: Diagnosis not present

## 2022-10-22 ENCOUNTER — Telehealth: Payer: Self-pay | Admitting: Family

## 2022-10-22 DIAGNOSIS — E039 Hypothyroidism, unspecified: Secondary | ICD-10-CM

## 2022-10-22 MED ORDER — LEVOTHYROXINE SODIUM 75 MCG PO TABS: 75.0000 ug | ORAL_TABLET | Freq: Every day | ORAL | 0 refills | Status: DC

## 2022-10-22 NOTE — Telephone Encounter (Signed)
Labs show that her synthroid dose needs to be decreased.  Please decrease from 88 mcg to . Repeat TSH in 6 weeks.

## 2022-10-27 NOTE — Telephone Encounter (Signed)
Patient notified of new dose and scheduled for TSH In 6 weeks.

## 2022-12-08 ENCOUNTER — Other Ambulatory Visit (INDEPENDENT_AMBULATORY_CARE_PROVIDER_SITE_OTHER): Payer: BC Managed Care – PPO

## 2022-12-08 DIAGNOSIS — E039 Hypothyroidism, unspecified: Secondary | ICD-10-CM | POA: Diagnosis not present

## 2022-12-09 LAB — TSH: TSH: 0.75 u[IU]/mL (ref 0.35–5.50)

## 2022-12-23 ENCOUNTER — Other Ambulatory Visit: Payer: Self-pay | Admitting: Family

## 2023-02-28 ENCOUNTER — Other Ambulatory Visit: Payer: Self-pay | Admitting: Family

## 2023-02-28 DIAGNOSIS — E039 Hypothyroidism, unspecified: Secondary | ICD-10-CM

## 2023-03-01 MED ORDER — LEVOTHYROXINE SODIUM 75 MCG PO TABS
75.0000 ug | ORAL_TABLET | Freq: Every day | ORAL | 1 refills | Status: DC
Start: 2023-03-01 — End: 2023-07-08

## 2023-05-26 ENCOUNTER — Other Ambulatory Visit: Payer: Self-pay | Admitting: Family

## 2023-05-26 NOTE — Telephone Encounter (Signed)
 Copied from CRM [id number]. Topic: Clinical - Medication Refill >> May 26, 2023  4:00 PM Antwanette L wrote: Most Recent Primary Care Visit:  Provider: [facility]  Department: [facility]  Visit Type: LAB  Date: 12/08/2022  Medication: ferrous sulfate  325 (65 FE) MG EC tablet  Has the patient contacted their pharmacy? Yes   Is this the correct pharmacy for this prescription? Yes  [id number] - [place], [place] - [address] AT [facility] [address] [place] [address] Phone: [phone] Fax: [phone]   Has the prescription been filled recently? No. Last refilled on 05/23/22  Is the patient out of the medication? No. Patient has 3 pills left.  Has the patient been seen for an appointment in the last year OR does the patient have an upcoming appointment? Yes. Last office visit was on 09/05/22 with Glenford LanesVira Grieves for a physical and lab visit on 12/08/22  Can we respond through MyChart? Yes  Agent: Please be advised that Rx refills may take up to 3 business days. We ask that you follow-up with your pharmacy.

## 2023-07-08 ENCOUNTER — Other Ambulatory Visit: Payer: Self-pay | Admitting: Family

## 2023-09-08 ENCOUNTER — Encounter: Payer: Self-pay | Admitting: Family

## 2023-09-08 ENCOUNTER — Ambulatory Visit (INDEPENDENT_AMBULATORY_CARE_PROVIDER_SITE_OTHER): Admitting: Family

## 2023-09-08 ENCOUNTER — Other Ambulatory Visit (HOSPITAL_BASED_OUTPATIENT_CLINIC_OR_DEPARTMENT_OTHER): Payer: Self-pay

## 2023-09-08 VITALS — BP 117/60 | HR 67 | Temp 98.8°F | Resp 16 | Ht 65.0 in | Wt 147.0 lb

## 2023-09-08 DIAGNOSIS — D509 Iron deficiency anemia, unspecified: Secondary | ICD-10-CM | POA: Diagnosis not present

## 2023-09-08 DIAGNOSIS — Z23 Encounter for immunization: Secondary | ICD-10-CM

## 2023-09-08 DIAGNOSIS — E039 Hypothyroidism, unspecified: Secondary | ICD-10-CM | POA: Diagnosis not present

## 2023-09-08 DIAGNOSIS — Z13220 Encounter for screening for lipoid disorders: Secondary | ICD-10-CM | POA: Diagnosis not present

## 2023-09-08 DIAGNOSIS — Z1231 Encounter for screening mammogram for malignant neoplasm of breast: Secondary | ICD-10-CM

## 2023-09-08 DIAGNOSIS — Z0000 Encounter for general adult medical examination without abnormal findings: Secondary | ICD-10-CM | POA: Diagnosis not present

## 2023-09-08 MED ORDER — LEVOTHYROXINE SODIUM 75 MCG PO TABS: 75.0000 ug | ORAL_TABLET | Freq: Every day | ORAL | 0 refills | Status: AC

## 2023-09-08 NOTE — Patient Instructions (Signed)
 VISIT SUMMARY:  Today, you had your annual physical exam. We discussed your recent unintentional weight loss, occasional constipation, left calf cramps, and left knee pain. We also reviewed your diabetes status and iron supplementation.  YOUR PLAN:  ADULT WELLNESS VISIT: Routine wellness visit with updated vaccines and screenings. -Administer pneumonia vaccine. -Administer hepatitis B vaccine. -Schedule follow-up for second hepatitis B shot in two months. -Order mammogram. -Check thyroid  function. -Check iron levels. -Check diabetes markers. -Check cholesterol levels.  LEFT CALF MUSCLE CRAMPS AND LEFT KNEE PAIN: Frequent cramps in your left calf and pain in your left knee. -Increase water intake. -Stretch calf muscles before bed.  INTERMITTENT CONSTIPATION: Occasional constipation managed with magnesium. -Continue managing constipation with magnesium.  UNINTENTIONAL WEIGHT LOSS, RESOLVED: Your weight loss issue has resolved. -Monitor your weight regularly.

## 2023-09-08 NOTE — Progress Notes (Signed)
 Subjective:     Patient ID: Amanda Roberson, female    DOB: July 05, 1970, 53 y.o.   MRN: [id number]  Chief Complaint  Patient presents with   Annual Exam    HPI  Discussed the use of AI scribe software for clinical note transcription with the patient, who gave verbal consent to proceed.  History of Present Illness   Burmese Video Interpreter Jerilynn [phone]  Amanda Roberson is a 53 year old female who presents for an annual physical exam. She has experienced unintentional weight loss this year. Occasional constipation is managed with magnesium, maintaining regular bowel movements. She frequently experiences cramps in her left calf, often at night, and pain in her left knee. She inquires about her diabetes status, with her last test in 2022 being normal. Her cholesterol was also normal in May 2022. She is currently taking iron supplements but has run out of refills and inquires about continuing the supplementation. No cough, cold symptoms, skin rash, headaches, depression, or anxiety. She occasionally experiences itchy skin but not a rash.      Health Maintenance Due  Topic Date Due   Hepatitis B Vaccines (1 of 3 - 19+ 3-dose series) Never done   Pneumococcal Vaccine: 50+ Years (1 of 1 - PCV) Never done   COVID-19 Vaccine (4 - 2024-25 season) 10/05/2022   INFLUENZA VACCINE  09/04/2023    Past Medical History:  Diagnosis Date   Allergy    LAST YEAR 2021   Anemia    PAST HX   Arthritis    KNEE   Subclinical hypothyroidism     Past Surgical History:  Procedure Laterality Date   OOPHORECTOMY Left 2014    Family History  Problem Relation Age of Onset   Breast cancer Sister        died at age 42   Colon polyps Neg Hx    Colon cancer Neg Hx    Esophageal cancer Neg Hx    Rectal cancer Neg Hx    Stomach cancer Neg Hx     Social History   Socioeconomic History   Marital status: Married    Spouse name: Not on file   Number of children: Not on file   Years of education:  Not on file   Highest education level: Not on file  Occupational History   Not on file  Tobacco Use   Smoking status: Never   Smokeless tobacco: Never  Substance and Sexual Activity   Alcohol use: Never   Drug use: Never   Sexual activity: Yes    Partners: Male  Other Topics Concern   Not on file  Social History Narrative   Moved from [place] 2019   Married   2 children in [place] (26 and 23)   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 45 tablet 1   levothyroxine  (SYNTHROID ) 75 MCG tablet Take 1 tablet (75  mcg total) by mouth daily before breakfast. 90 tablet 0   No facility-administered medications prior to visit.    No Known Allergies  Review of Systems  Constitutional:  Positive for weight loss.  HENT:  Negative for congestion and hearing loss.   Eyes:  Negative for blurred vision.  Respiratory:  Negative for cough.   Cardiovascular:  Negative for leg swelling.  Gastrointestinal:  Negative for constipation and diarrhea.  Genitourinary:  Negative for dysuria and frequency.  Musculoskeletal:  Negative for joint pain and myalgias.  Skin:  Negative for rash.  Neurological:  Negative for headaches.  Psychiatric/Behavioral:  Negative for depression. The patient is not nervous/anxious.        Objective:    Physical Exam   BP 117/60 (BP Location: Right Arm, Patient Position: Sitting, Cuff Size: Normal)   Pulse 67   Temp 98.8 F (37.1 C) (Oral)   Resp 16   Ht 5' 5 (1.651  m)   Wt 147 lb (66.7 kg)   SpO2 99%   BMI 24.46 kg/m  Wt Readings from Last 3 Encounters:  09/08/23 147 lb (66.7 kg)  09/05/22 156 lb (70.8 kg)  02/21/22 150 lb (68 kg)   Physical Exam  Constitutional: She is oriented to person, place, and time. She appears well-developed and well-nourished. No distress.  HENT:  Head: Normocephalic and atraumatic.  Right Ear: Tympanic membrane and ear canal normal.  Left Ear: Tympanic membrane and ear canal normal.  Mouth/Throat: Oropharynx is clear and moist.  Eyes: Pupils are equal, round, and reactive to light. No scleral icterus.  Neck: Normal range of motion. No thyromegaly present.  Cardiovascular: Normal rate and regular rhythm.   No murmur heard. Pulmonary/Chest: Effort normal and breath sounds normal. No respiratory distress. He has no wheezes. She has no rales. She exhibits no tenderness.  Abdominal: Soft. Bowel sounds are normal. She exhibits no distension and no mass. There is no tenderness. There is no rebound and no guarding.  Musculoskeletal: She exhibits no edema.  Lymphadenopathy:    She has no cervical adenopathy.  Neurological: She is alert and oriented to person, place, and time. She has normal patellar reflexes. She exhibits normal muscle tone. Coordination normal.  Skin: Skin is warm and dry.  Psychiatric: She has a normal mood and affect. Her behavior is normal. Judgment and thought content normal.  Breast/Pelvic: deferred         Assessment & Plan:        Assessment & Plan:   Problem List Items Addressed This Visit       Unprioritized   Preventative health care - Primary    Routine wellness visit. Vaccines and screenings updated. No family health changes.  - Administer pneumonia vaccine.  - Administer hepatitis B vaccine.  - Schedule follow-up for second hepatitis B shot in two months.  - Order mammogram.  - Check thyroid  function.  - Check iron levels.  - Check cholesterol levels.       Relevant  Orders   Comp Met (CMET)   Lipid panel   Iron deficiency anemia   Relevant Orders   CBC w/Diff   IBC + Ferritin   Hypothyroid   Advised pt to let me know if she has any further weight loss.       Relevant Medications   levothyroxine  (SYNTHROID ) 75 MCG tablet   Other Relevant Orders   TSH   Other Visit Diagnoses       Breast cancer screening by mammogram  Relevant Orders   MM 3D SCREENING MAMMOGRAM BILATERAL BREAST       I am having Amanda Roberson maintain her ferrous sulfate  and levothyroxine .  Meds ordered this encounter  Medications   levothyroxine  (SYNTHROID ) 75 MCG tablet    Sig: Take 1 tablet (75 mcg total) by mouth daily before breakfast.    Dispense:  90 tablet    Refill:  0    Supervising Provider:   DOMENICA BLACKBIRD A [[id number]]

## 2023-09-08 NOTE — Assessment & Plan Note (Signed)
 Advised pt to let me know if she has any further weight loss.

## 2023-09-08 NOTE — Addendum Note (Signed)
 Addended by: WELLS LEVORN HERO on: 09/08/2023 04:43 PM   Modules accepted: Orders

## 2023-09-08 NOTE — Assessment & Plan Note (Signed)
  Routine wellness visit. Vaccines and screenings updated. No family health changes.  - Administer pneumonia vaccine.  - Administer hepatitis B vaccine.  - Schedule follow-up for second hepatitis B shot in two months.  - Order mammogram.  - Check thyroid  function.  - Check iron levels.  - Check cholesterol levels.

## 2023-09-09 LAB — COMPREHENSIVE METABOLIC PANEL WITH GFR
ALT: 20 U/L (ref 0–35)
AST: 24 U/L (ref 0–37)
Albumin: 4.3 g/dL (ref 3.5–5.2)
Alkaline Phosphatase: 56 U/L (ref 39–117)
BUN: 19 mg/dL (ref 6–23)
CO2: 29 meq/L (ref 19–32)
Calcium: 9 mg/dL (ref 8.4–10.5)
Chloride: 104 meq/L (ref 96–112)
Creatinine, Ser: 0.73 mg/dL (ref 0.40–1.20)
GFR: 94.98 mL/min (ref >60.00)
Glucose, Bld: 87 mg/dL (ref 70–99)
Potassium: 4.3 meq/L (ref 3.5–5.1)
Sodium: 140 meq/L (ref 135–145)
Total Bilirubin: 0.3 mg/dL (ref 0.2–1.2)
Total Protein: 7.1 g/dL (ref 6.0–8.3)

## 2023-09-09 LAB — CBC WITH DIFFERENTIAL/PLATELET
Basophils Absolute: 0 K/uL (ref 0.0–0.1)
Basophils Relative: 0.4 % (ref 0.0–3.0)
Eosinophils Absolute: 0.2 K/uL (ref 0.0–0.7)
Eosinophils Relative: 3.6 % (ref 0.0–5.0)
HCT: 36.5 % (ref 36.0–46.0)
Hemoglobin: 12 g/dL (ref 12.0–15.0)
Lymphocytes Relative: 36.4 % (ref 12.0–46.0)
Lymphs Abs: 2.3 K/uL (ref 0.7–4.0)
MCHC: 32.8 g/dL (ref 30.0–36.0)
MCV: 84.5 fl (ref 78.0–100.0)
Monocytes Absolute: 0.4 K/uL (ref 0.1–1.0)
Monocytes Relative: 6.6 % (ref 3.0–12.0)
Neutro Abs: 3.4 K/uL (ref 1.4–7.7)
Neutrophils Relative %: 53 % (ref 43.0–77.0)
Platelets: 105 K/uL — ABNORMAL LOW (ref 150.0–400.0)
RBC: 4.32 Mil/uL (ref 3.87–5.11)
RDW: 13.5 % (ref 11.5–15.5)
WBC: 6.4 K/uL (ref 4.0–10.5)

## 2023-09-09 LAB — IBC + FERRITIN
Ferritin: 85.5 ng/mL (ref 10.0–291.0)
Iron: 45 ug/dL (ref 42–145)
Saturation Ratios: 15.2 % — ABNORMAL LOW (ref 20.0–50.0)
TIBC: 295.4 ug/dL (ref 250.0–450.0)
Transferrin: 211 mg/dL — ABNORMAL LOW (ref 212.0–360.0)

## 2023-09-09 LAB — LIPID PANEL
Cholesterol: 169 mg/dL (ref 0–200)
HDL: 47.1 mg/dL (ref >39.00)
LDL Cholesterol: 99 mg/dL (ref 0–99)
NonHDL: 122.06
Total CHOL/HDL Ratio: 4
Triglycerides: 115 mg/dL (ref 0.0–149.0)
VLDL: 23 mg/dL (ref 0.0–40.0)

## 2023-09-09 LAB — TSH: TSH: 2.01 u[IU]/mL (ref 0.35–5.50)

## 2023-09-10 ENCOUNTER — Telehealth: Payer: Self-pay | Admitting: Family

## 2023-09-10 DIAGNOSIS — D696 Thrombocytopenia, unspecified: Secondary | ICD-10-CM

## 2023-09-10 NOTE — Telephone Encounter (Signed)
 Her platelet count is low.  I would like to refer her to hematology for further evaluation.   Other labs are stable. No changes to medication.

## 2023-09-14 ENCOUNTER — Ambulatory Visit (HOSPITAL_BASED_OUTPATIENT_CLINIC_OR_DEPARTMENT_OTHER)
Admission: RE | Admit: 2023-09-14 | Discharge: 2023-09-14 | Disposition: A | Discharge status: Home / Self Care | Source: Ambulatory Visit | Attending: Family | Admitting: Family

## 2023-09-14 ENCOUNTER — Encounter (HOSPITAL_BASED_OUTPATIENT_CLINIC_OR_DEPARTMENT_OTHER): Payer: Self-pay

## 2023-09-14 DIAGNOSIS — Z1231 Encounter for screening mammogram for malignant neoplasm of breast: Secondary | ICD-10-CM | POA: Insufficient documentation

## 2023-09-16 ENCOUNTER — Ambulatory Visit: Payer: Self-pay | Admitting: Family

## 2023-09-16 NOTE — Telephone Encounter (Signed)
 Have called patient and her husband a few times but no answer and no voice mail.  MyChart message sent to patient today.

## 2023-10-13 ENCOUNTER — Other Ambulatory Visit: Payer: Self-pay | Admitting: Family

## 2023-10-13 DIAGNOSIS — D696 Thrombocytopenia, unspecified: Secondary | ICD-10-CM

## 2023-10-14 ENCOUNTER — Encounter: Payer: Self-pay | Admitting: Family

## 2023-10-14 ENCOUNTER — Inpatient Hospital Stay: Admitting: Family

## 2023-10-14 ENCOUNTER — Inpatient Hospital Stay: Attending: Hematology & Oncology

## 2023-10-14 VITALS — BP 133/80 | HR 60 | Temp 98.4°F | Resp 17 | Ht 65.0 in | Wt 145.8 lb

## 2023-10-14 DIAGNOSIS — D696 Thrombocytopenia, unspecified: Secondary | ICD-10-CM | POA: Diagnosis not present

## 2023-10-14 DIAGNOSIS — R002 Palpitations: Secondary | ICD-10-CM | POA: Diagnosis not present

## 2023-10-14 DIAGNOSIS — M1990 Unspecified osteoarthritis, unspecified site: Secondary | ICD-10-CM | POA: Diagnosis not present

## 2023-10-14 DIAGNOSIS — E038 Other specified hypothyroidism: Secondary | ICD-10-CM | POA: Diagnosis not present

## 2023-10-14 DIAGNOSIS — K635 Polyp of colon: Secondary | ICD-10-CM | POA: Insufficient documentation

## 2023-10-14 DIAGNOSIS — Z90721 Acquired absence of ovaries, unilateral: Secondary | ICD-10-CM | POA: Insufficient documentation

## 2023-10-14 DIAGNOSIS — Z79899 Other long term (current) drug therapy: Secondary | ICD-10-CM | POA: Diagnosis not present

## 2023-10-14 DIAGNOSIS — Z803 Family history of malignant neoplasm of breast: Secondary | ICD-10-CM | POA: Diagnosis not present

## 2023-10-14 DIAGNOSIS — Z79890 Hormone replacement therapy (postmenopausal): Secondary | ICD-10-CM

## 2023-10-14 LAB — CBC WITH DIFFERENTIAL (CANCER CENTER ONLY)
Abs Immature Granulocytes: 0.02 K/uL (ref 0.00–0.07)
Basophils Absolute: 0.1 K/uL (ref 0.0–0.1)
Basophils Relative: 1 %
Eosinophils Absolute: 0.2 K/uL (ref 0.0–0.5)
Eosinophils Relative: 4 %
HCT: 36.9 % (ref 36.0–46.0)
Hemoglobin: 12.2 g/dL (ref 12.0–15.0)
Immature Granulocytes: 0 %
Lymphocytes Relative: 37 %
Lymphs Abs: 2.4 K/uL (ref 0.7–4.0)
MCH: 27.5 pg (ref 26.0–34.0)
MCHC: 33.1 g/dL (ref 30.0–36.0)
MCV: 83.3 fL (ref 80.0–100.0)
Monocytes Absolute: 0.4 K/uL (ref 0.1–1.0)
Monocytes Relative: 7 %
Neutro Abs: 3.3 K/uL (ref 1.7–7.7)
Neutrophils Relative %: 51 %
Platelet Count: 178 K/uL (ref 150–400)
RBC: 4.43 MIL/uL (ref 3.87–5.11)
RDW: 13.4 % (ref 11.5–15.5)
WBC Count: 6.4 K/uL (ref 4.0–10.5)
nRBC: 0 % (ref 0.0–0.2)

## 2023-10-14 LAB — CMP (CANCER CENTER ONLY)
ALT: 27 U/L (ref 0–44)
AST: 30 U/L (ref 15–41)
Albumin: 4.5 g/dL (ref 3.5–5.0)
Alkaline Phosphatase: 70 U/L (ref 38–126)
Anion gap: 10 (ref 5–15)
BUN: 19 mg/dL (ref 6–20)
CO2: 26 mmol/L (ref 22–32)
Calcium: 9.2 mg/dL (ref 8.9–10.3)
Chloride: 105 mmol/L (ref 98–111)
Creatinine: 0.78 mg/dL (ref 0.44–1.00)
GFR, Estimated: >60 mL/min (ref >60)
Glucose, Bld: 104 mg/dL — ABNORMAL HIGH (ref 70–99)
Potassium: 3.9 mmol/L (ref 3.5–5.1)
Sodium: 141 mmol/L (ref 135–145)
Total Bilirubin: 0.3 mg/dL (ref 0.0–1.2)
Total Protein: 7.6 g/dL (ref 6.5–8.1)

## 2023-10-14 LAB — SAVE SMEAR(SSMR), FOR PROVIDER SLIDE REVIEW

## 2023-10-14 LAB — LACTATE DEHYDROGENASE: LDH: 350 U/L — ABNORMAL HIGH (ref 98–192)

## 2023-10-14 NOTE — Progress Notes (Signed)
 Hematology/Oncology Consultation   Name: Amanda Roberson      MRN: [id number]    Location: Room/bed info not found  Date: 10/14/2023 Time:3:34 PM   REFERRING PHYSICIAN:  Eleanor Ponto, NP  REASON FOR CONSULT:  Thrombocytopenia   DIAGNOSIS: Intermittent thrombocytopenia   HISTORY OF PRESENT ILLNESS:  Amanda Roberson is a very pleasant 53 yo Burmese female here today with her husband. She does not speak English so we did use in interpretor on line service. She is here for intermittent thrombocytopenia.  She remains asymptomatic with this.  Today's platelet count is 178, WBC count 6.4, Hgb 12.2. and MCV 83. Differential is unremarkable.  No issues with abnormal bleeding, bruising or petechiae.  She states that 15 years ago she was hospitalized in [place] and treated for an enlarged liver. She is unsure what caused this or what medication she received.  She denies hepatitis.  We will get an US  of the abdomen and take a look at both the liver and spleen. She is scheduled for 9/13 at 2 pm.  No pain or organomegaly noted on exam.  No known familial history of liver disease or thrombocytopenia.  Colonoscopy was completed in 11/2020. She had one benign polyp removed from the distal ascending colon.  No personal history of cancer. Her sister had breast cancer.  Patients mammogram last month was negative.  She states that she went through menopause at 53 yo.  No diabetes. She has hypothyroidism and she is on daily synthroid .  No issue with frequent or recurrent infections.  No fever, chills, n/v, cough, rash, dizziness, SOB, chest pain, palpitations, abdominal pain or changes in bowel or bladder habits.  She notes occasional palpitations.  No swelling, tenderness, numbness or tingling in her extremities.  No falls or syncope reported.  Appetite and hydration are good. Weight is stable at 145 lbs.  No smoking, ETOH or recreational drug use.  She works for [employer].    Of note: Her atanacio Amanda Roberson helps with translating any medical information and appointment scheduling [phone].   ROS: All other 10 point review of systems is negative.   PAST MEDICAL HISTORY:   Past Medical History:  Diagnosis Date   Allergy    LAST YEAR 2021   Anemia    PAST HX   Arthritis    KNEE   Subclinical hypothyroidism     ALLERGIES: No Known Allergies    MEDICATIONS:  Current Outpatient Medications on File Prior to Visit  Medication Sig Dispense Refill   ferrous sulfate  325 (65 FE) MG EC tablet Take 1 tablet (325 mg total) by mouth every other day. 45 tablet 1   levothyroxine  (SYNTHROID ) 75 MCG tablet Take 1 tablet (75 mcg total) by mouth daily before breakfast. 90 tablet 0   No current facility-administered medications on file prior to visit.     PAST SURGICAL HISTORY Past Surgical History:  Procedure Laterality Date   OOPHORECTOMY Left 2014    FAMILY HISTORY: Family History  Problem Relation Age of Onset   Breast cancer Sister        died at age 36   Colon polyps Neg Hx    Colon cancer Neg Hx    Esophageal cancer Neg Hx    Rectal cancer Neg Hx    Stomach cancer Neg Hx     SOCIAL HISTORY:  reports that she has never smoked. She has never used smokeless tobacco. She reports that she does not drink alcohol and does  not use drugs.  PERFORMANCE STATUS: The patient's performance status is 1 - Symptomatic but completely ambulatory  PHYSICAL EXAM: Most Recent Vital Signs: There were no vitals taken for this visit. There were no vitals taken for this visit.  General Appearance:    Alert, cooperative, no distress, appears stated age  Head:    Normocephalic, without obvious abnormality, atraumatic  Eyes:    PERRL, conjunctiva/corneas clear, EOM's intact, fundi    benign, both eyes        Throat:   Lips, mucosa, and tongue normal; teeth and gums normal  Neck:   Supple, symmetrical, trachea midline, no adenopathy;    thyroid :  no enlargement/tenderness/nodules; no  carotid   bruit or JVD  Back:     Symmetric, no curvature, ROM normal, no CVA tenderness  Lungs:     Clear to auscultation bilaterally, respirations unlabored  Chest Wall:    No tenderness or deformity   Heart:    Regular rate and rhythm, S1 and S2 normal, no murmur, rub   or gallop     Abdomen:     Soft, non-tender, bowel sounds active all four quadrants,    no masses, no organomegaly        Extremities:   Extremities normal, atraumatic, no cyanosis or edema  Pulses:   2+ and symmetric all extremities  Skin:   Skin color, texture, turgor normal, no rashes or lesions  Lymph nodes:   Cervical, supraclavicular, and axillary nodes normal  Neurologic:   CNII-XII intact, normal strength, sensation and reflexes    throughout    LABORATORY DATA:  Results for orders placed or performed in visit on 10/14/23 (from the past 48 hours)  CBC with Differential (Cancer Center Only)     Status: None   Collection Time: 10/14/23  3:28 PM  Result Value Ref Range   WBC Count 6.4 4.0 - 10.5 K/uL   RBC 4.43 3.87 - 5.11 MIL/uL   Hemoglobin 12.2 12.0 - 15.0 g/dL   HCT 63.0 63.9 - 53.9 %   MCV 83.3 80.0 - 100.0 fL   MCH 27.5 26.0 - 34.0 pg   MCHC 33.1 30.0 - 36.0 g/dL   RDW 86.5 88.4 - 84.4 %   Platelet Count 178 150 - 400 K/uL   nRBC 0.0 0.0 - 0.2 %   Neutrophils Relative % 51 %   Neutro Abs 3.3 1.7 - 7.7 K/uL   Lymphocytes Relative 37 %   Lymphs Abs 2.4 0.7 - 4.0 K/uL   Monocytes Relative 7 %   Monocytes Absolute 0.4 0.1 - 1.0 K/uL   Eosinophils Relative 4 %   Eosinophils Absolute 0.2 0.0 - 0.5 K/uL   Basophils Relative 1 %   Basophils Absolute 0.1 0.0 - 0.1 K/uL   Immature Granulocytes 0 %   Abs Immature Granulocytes 0.02 0.00 - 0.07 K/uL    Comment: Performed at [facility], [address] [place], [place] [address]  Save Smear for Provider Slide Review     Status: None   Collection Time: 10/14/23  3:28 PM  Result Value Ref Range   Smear Review SMEAR STAINED AND AVAILABLE FOR  REVIEW     Comment: Performed at [facility], [address] [place], [place] [address]      RADIOGRAPHY: No results found.     PATHOLOGY: None   ASSESSMENT/PLAN: Ms. Daponte is a very pleasant 53 yo Burmese female here today with her husband.  CBC  with diff and blood smear reviewed in detail with Dr. Timmy. No abnormality or evidence of malignancy noted.  US  of the abdomen is negative.  We have reached out to her Atanacio and updated him on these results.  We will plan to see her again in 6 months.  All questions were answered. The patient knows to call the clinic with any problems, questions or concerns. We can certainly see the patient much sooner if necessary.  The patient was discussed with Dr. Timmy and he is in agreement with the aforementioned.   Lauraine Pepper, NP

## 2023-10-17 ENCOUNTER — Ambulatory Visit (HOSPITAL_BASED_OUTPATIENT_CLINIC_OR_DEPARTMENT_OTHER)
Admission: RE | Admit: 2023-10-17 | Discharge: 2023-10-17 | Disposition: A | Discharge status: Home / Self Care | Source: Ambulatory Visit | Attending: Family | Admitting: Family

## 2023-10-17 DIAGNOSIS — D696 Thrombocytopenia, unspecified: Secondary | ICD-10-CM | POA: Insufficient documentation

## 2023-10-22 ENCOUNTER — Telehealth: Payer: Self-pay

## 2023-10-22 NOTE — Telephone Encounter (Signed)
 RN attempted call to Amanda Roberson as requested by patient to discuss results per Lauraine Pepper, NP. Unable to reach Amanda Roberson and VM is not self-identifying. RN will attempt to reach him again at a later time.

## 2023-10-26 NOTE — Telephone Encounter (Signed)
 RN contacted Amanda Roberson (as requested by patient) and relayed normal blood work and US  results per Lauraine Pepper, NP. RN instructed Amanda that patient is to return in 6 months for follow up and he verbalized understanding.

## 2023-11-18 ENCOUNTER — Ambulatory Visit

## 2023-11-24 NOTE — Progress Notes (Unsigned)
 Patient is here for a Hep B vaccine dose 2 per order from Eleanor Ponto, NP. 09/08/2023: Return in about 2 months (around 11/08/2023) for nurse visit for Heplisav B #2  Verified no previous allergy to vaccine.  Hep B injection to left deltoid with no apparent complications. Patient advised to call with any problems.

## 2023-11-25 ENCOUNTER — Ambulatory Visit (INDEPENDENT_AMBULATORY_CARE_PROVIDER_SITE_OTHER): Admitting: *Deleted

## 2023-11-25 DIAGNOSIS — Z23 Encounter for immunization: Secondary | ICD-10-CM

## 2024-04-27 ENCOUNTER — Ambulatory Visit: Admitting: Hematology & Oncology

## 2024-04-27 ENCOUNTER — Inpatient Hospital Stay: Attending: Hematology & Oncology
